# Patient Record
Sex: Female | Born: 1975
Health system: Southern US, Community
[De-identification: ages and names within clinical notes are randomized; demographics above are authoritative.]

## PROBLEM LIST (undated history)

## (undated) DIAGNOSIS — J45909 Unspecified asthma, uncomplicated: Secondary | ICD-10-CM

## (undated) DIAGNOSIS — K219 Gastro-esophageal reflux disease without esophagitis: Secondary | ICD-10-CM

## (undated) DIAGNOSIS — E860 Dehydration: Secondary | ICD-10-CM

## (undated) DIAGNOSIS — N61 Mastitis without abscess: Principal | ICD-10-CM

## (undated) DIAGNOSIS — D6489 Other specified anemias: Secondary | ICD-10-CM

## (undated) DIAGNOSIS — J309 Allergic rhinitis, unspecified: Secondary | ICD-10-CM

## (undated) DIAGNOSIS — F341 Dysthymic disorder: Secondary | ICD-10-CM

## (undated) DIAGNOSIS — T7840XA Allergy, unspecified, initial encounter: Secondary | ICD-10-CM

## (undated) HISTORY — DX: Unspecified asthma, uncomplicated: J45.909

## (undated) HISTORY — PX: NO PAST SURGERIES: SHX2092

## (undated) HISTORY — DX: Allergic rhinitis, unspecified: J30.9

## (undated) HISTORY — DX: Mastitis without abscess: N61.0

## (undated) HISTORY — DX: Allergy, unspecified, initial encounter: T78.40XA

## (undated) HISTORY — DX: Dysthymic disorder: F34.1

## (undated) HISTORY — DX: Other specified anemias: D64.89

## (undated) HISTORY — DX: Dehydration: E86.0

---

## 1998-08-28 ENCOUNTER — Other Ambulatory Visit: Admission: RE | Admit: 1998-08-28 | Discharge: 1998-08-28 | Payer: Self-pay | Admitting: Gynecology

## 1999-09-02 ENCOUNTER — Other Ambulatory Visit: Admission: RE | Admit: 1999-09-02 | Discharge: 1999-09-02 | Payer: Self-pay | Admitting: Gynecology

## 2000-01-23 ENCOUNTER — Encounter: Admission: RE | Admit: 2000-01-23 | Discharge: 2000-01-23 | Payer: Self-pay | Admitting: Sports Medicine

## 2000-09-08 ENCOUNTER — Other Ambulatory Visit: Admission: RE | Admit: 2000-09-08 | Discharge: 2000-09-08 | Payer: Self-pay | Admitting: Gynecology

## 2001-08-31 ENCOUNTER — Other Ambulatory Visit: Admission: RE | Admit: 2001-08-31 | Discharge: 2001-08-31 | Payer: Self-pay | Admitting: Gynecology

## 2002-04-20 ENCOUNTER — Encounter: Admission: RE | Admit: 2002-04-20 | Discharge: 2002-04-20 | Payer: Self-pay | Admitting: Family Medicine

## 2002-09-20 ENCOUNTER — Other Ambulatory Visit: Admission: RE | Admit: 2002-09-20 | Discharge: 2002-09-20 | Payer: Self-pay | Admitting: Gynecology

## 2004-08-25 ENCOUNTER — Inpatient Hospital Stay (HOSPITAL_COMMUNITY): Admission: AD | Admit: 2004-08-25 | Discharge: 2004-08-27 | Payer: Self-pay | Admitting: Obstetrics and Gynecology

## 2006-01-06 ENCOUNTER — Observation Stay (HOSPITAL_COMMUNITY): Admission: AD | Admit: 2006-01-06 | Discharge: 2006-01-06 | Payer: Self-pay | Admitting: Obstetrics & Gynecology

## 2006-04-22 ENCOUNTER — Ambulatory Visit (HOSPITAL_COMMUNITY): Admission: AD | Admit: 2006-04-22 | Discharge: 2006-04-22 | Payer: Self-pay | Admitting: Obstetrics & Gynecology

## 2006-04-27 ENCOUNTER — Ambulatory Visit (HOSPITAL_COMMUNITY): Admission: AD | Admit: 2006-04-27 | Discharge: 2006-04-27 | Payer: Self-pay | Admitting: Obstetrics & Gynecology

## 2006-06-10 ENCOUNTER — Ambulatory Visit (HOSPITAL_COMMUNITY): Admission: AD | Admit: 2006-06-10 | Discharge: 2006-06-10 | Payer: Self-pay | Admitting: Obstetrics and Gynecology

## 2006-06-12 ENCOUNTER — Encounter (INDEPENDENT_AMBULATORY_CARE_PROVIDER_SITE_OTHER): Payer: Self-pay | Admitting: *Deleted

## 2006-06-12 ENCOUNTER — Inpatient Hospital Stay (HOSPITAL_COMMUNITY): Admission: AD | Admit: 2006-06-12 | Discharge: 2006-06-14 | Payer: Self-pay | Admitting: Obstetrics & Gynecology

## 2008-06-14 ENCOUNTER — Other Ambulatory Visit: Admission: RE | Admit: 2008-06-14 | Discharge: 2008-06-14 | Payer: Self-pay | Admitting: Obstetrics and Gynecology

## 2008-12-31 ENCOUNTER — Ambulatory Visit: Payer: Self-pay | Admitting: Family Medicine

## 2008-12-31 ENCOUNTER — Inpatient Hospital Stay (HOSPITAL_COMMUNITY): Admission: AD | Admit: 2008-12-31 | Discharge: 2009-01-01 | Payer: Self-pay | Admitting: Obstetrics and Gynecology

## 2009-10-30 ENCOUNTER — Other Ambulatory Visit: Admission: RE | Admit: 2009-10-30 | Discharge: 2009-10-30 | Payer: Self-pay | Admitting: Obstetrics and Gynecology

## 2010-05-12 ENCOUNTER — Inpatient Hospital Stay (HOSPITAL_COMMUNITY): Admission: AD | Admit: 2010-05-12 | Discharge: 2010-05-13 | Payer: Self-pay | Admitting: Obstetrics & Gynecology

## 2010-05-12 ENCOUNTER — Encounter: Payer: Self-pay | Admitting: Obstetrics & Gynecology

## 2010-05-12 ENCOUNTER — Ambulatory Visit: Payer: Self-pay | Admitting: Obstetrics and Gynecology

## 2010-08-12 ENCOUNTER — Encounter: Payer: Self-pay | Admitting: Family Medicine

## 2010-11-21 ENCOUNTER — Ambulatory Visit
Admission: RE | Admit: 2010-11-21 | Discharge: 2010-11-21 | Payer: Self-pay | Source: Home / Self Care | Attending: Family Medicine | Admitting: Family Medicine

## 2010-11-21 DIAGNOSIS — J309 Allergic rhinitis, unspecified: Secondary | ICD-10-CM

## 2010-11-21 DIAGNOSIS — F341 Dysthymic disorder: Secondary | ICD-10-CM

## 2010-11-21 DIAGNOSIS — D6489 Other specified anemias: Secondary | ICD-10-CM | POA: Insufficient documentation

## 2010-11-21 DIAGNOSIS — F409 Phobic anxiety disorder, unspecified: Secondary | ICD-10-CM | POA: Insufficient documentation

## 2010-11-21 HISTORY — DX: Dysthymic disorder: F34.1

## 2010-11-21 HISTORY — DX: Allergic rhinitis, unspecified: J30.9

## 2010-11-21 HISTORY — DX: Other specified anemias: D64.89

## 2010-12-18 ENCOUNTER — Telehealth: Payer: Self-pay | Admitting: Family Medicine

## 2010-12-18 ENCOUNTER — Encounter: Payer: Self-pay | Admitting: Family Medicine

## 2010-12-18 NOTE — Letter (Signed)
Summary: [facility]  [facility]   Imported By: Lester [place] 11/26/2010 09:12:16  _____________________________________________________________________  External Attachment:    Type:   Image     Comment:   External Document

## 2010-12-18 NOTE — Assessment & Plan Note (Signed)
Summary: establish care/depoe/vfw   Vital Signs:  Patient profile:   35 year old female Height:      68.75 inches Weight:      149.25 pounds BMI:     22.28 O2 Sat:      99 % on Room air Pulse rate:   86 / minute BP sitting:   120 / 78  (right arm) Cuff size:   regular  Vitals Entered By: Francee Piccolo CMA Duncan Dull) (November 21, 2010 9:53 AM)  O2 Flow:  Room air CC: new to establish//depo injection//SP   History of Present Illness: Patient is a 35yo Caucasian female in today for new patient appt. She is generally in good health but would like to restart her Depo Provera shots. She is over a week overdue so has undergone a urine pregnancy test which was neg and will return in a week for a repeat urine test, if this is neg as well she will restart the Depo Provera. She denies any recent illness or concerns. She has a history of allergies, which are intermittent and are not bothering her at the present time. She has significant ocular symptoms and has had a good response to Optivar, Omnaris and Xyzal in the past as needed. She denies any f/c/HA/CP/SOB/GI or GU c/o. She has been on an SSRI for several years, was initially on Lexapro which worked well for a number of years but stopped working during her last pregnancy. She notes she does have an element of panic disorder at times. These symptoms are infrequent but she does get a sense of palpitations and increased anxiety at random times. She reports sometimes just worrying about the feeling is enough to bring on the symptoms. Ususally if she takes a deep breath and relaxes the symptoms will dissipate.   Preventive Screening-Counseling & Management  Alcohol-Tobacco     Smoking Status: never  Caffeine-Diet-Exercise     Does Patient Exercise: yes      Drug Use:  no.    Current Problems (verified): 1)  Anxiety Depression  (ICD-300.4) 2)  Other Specified Anemias  (ICD-285.8) 3)  Allergic Rhinitis  (ICD-477.9)  Current Medications  (verified): 1)  Zoloft 100 Mg Tabs (Sertraline Hcl) .... Take 2 Tablets By Mouth Daily 2)  Prenatal Vitamin .... Take 1 Tablet By Mouth Once A Day  Allergies (verified): No Known Drug Allergies  Past History:  Past Surgical History: Denies surgical history  Family History: Father: 51, DMII, PUD Mother:65, hyperlipidemia, OA Siblings:  Sister: 51, Migraines Brother: 28, Recovering from substance abuse, Bipolar D/O, panic disorder. MGM: deceased in early 42s, uterine cancer, arthritis MGF: deceased in early 25s, heart disease w/multiple heart attacks, cancer PGM: deceased in early 96s, DM, HTN,  PGF: deceased in mid 34s, Multiple Myeloma Children: Son: 6, A&W Son: 4, A&W Daughter: 4, A&W Daghter: 22 months, A&W Daughter: 6 months, A&W  Social History: Married Never Smoked Occupation: Therapist, music Alcohol use-yes, rare special occasions Drug use-no Regular exercise-yes, yoga intermittently Seat belt regularly No dietary restrictions Smoking Status:  never Occupation:  employed Drug Use:  no Does Patient Exercise:  yes  Review of Systems  The patient denies anorexia, fever, weight loss, weight gain, vision loss, decreased hearing, hoarseness, chest pain, syncope, dyspnea on exertion, peripheral edema, prolonged cough, headaches, hemoptysis, abdominal pain, melena, hematochezia, severe indigestion/heartburn, hematuria, incontinence, muscle weakness, suspicious skin lesions, transient blindness, difficulty walking, depression, unusual weight change, breast masses, and testicular masses.    Physical Exam  General:  Well-developed,well-nourished,in no acute distress; alert,appropriate and cooperative throughout examination Head:  Normocephalic and atraumatic without obvious abnormalities. No apparent alopecia or balding. Eyes:  No corneal or conjunctival inflammation noted. EOMI. Perrla. Funduscopic exam benign, without hemorrhages, exudates or papilledema. Vision  grossly normal. Ears:  External ear exam shows no significant lesions or deformities.  Otoscopic examination reveals clear canals, tympanic membranes are intact bilaterally without bulging, retraction, inflammation or discharge. Hearing is grossly normal bilaterally. Nose:  External nasal examination shows no deformity or inflammation. Nasal mucosa are pink and moist without lesions or exudates. Mouth:  Oral mucosa and oropharynx without lesions or exudates.  Teeth in good repair. Neck:  No deformities, masses, or tenderness noted. Lungs:  Normal respiratory effort, chest expands symmetrically. Lungs are clear to auscultation, no crackles or wheezes. Heart:  Normal rate and regular rhythm. S1 and S2 normal without gallop, murmur, click, rub or other extra sounds. Abdomen:  Bowel sounds positive,abdomen soft and non-tender without masses, organomegaly or hernias noted. Msk:  No deformity or scoliosis noted of thoracic or lumbar spine.   Pulses:  R and L carotid,radial,femoral,dorsalis pedis and posterior tibial pulses are full and equal bilaterally Extremities:  No clubbing, cyanosis, edema, or deformity noted with normal full range of motion of all joints.   Neurologic:  No cranial nerve deficits noted. Station and gait are normal. Plantar reflexes are down-going bilaterally. DTRs are symmetrical throughout. Sensory, motor and coordinative functions appear intact. Skin:  Intact without suspicious lesions or rashes Cervical Nodes:  No lymphadenopathy noted Psych:  Cognition and judgment appear intact. Alert and cooperative with normal attention span and concentration. No apparent delusions, illusions, hallucinations   Impression & Recommendations:  Problem # 1:  OTHER SPECIFIED ANEMIAS (ICD-285.8) Had trouble with mild anemia during pregnancy, encouraged plenty of leafy greens and repeat a CBC in next few months with annual labs  Problem # 2:  ANXIETY DEPRESSION (ICD-300.4) Is on Sertraline  200mg  daily, Has only been on this dose just over a month. If her response if iincomplete or her panic component worsens would consider switching to a different agent such as Vibryd or Effexor  Problem # 3:  ALLERGIC RHINITIS (ICD-477.9)  Her updated medication list for this problem includes:    Omnaris 50 Mcg/act Susp (Ciclesonide) .Marland Kitchen... 1-2 sprays each nostril    Xyzal 5 Mg Tabs (Levocetirizine dihydrochloride) .Marland Kitchen... 1 tab by mouth daily as needed allergies Does not have any conerning symptoms but may continue to use as needed meds.  Complete Medication List: 1)  Zoloft 100 Mg Tabs (Sertraline hcl) .... Take 2 tablets by mouth daily 2)  Prenatal Vitamin  .... Take 1 tablet by mouth once a day 3)  Omnaris 50 Mcg/act Susp (Ciclesonide) .Marland Kitchen.. 1-2 sprays each nostril 4)  Xyzal 5 Mg Tabs (Levocetirizine dihydrochloride) .Marland Kitchen.. 1 tab by mouth daily as needed allergies 5)  Proair Hfa 108 (90 Base) Mcg/act Aers (Albuterol sulfate) .Marland Kitchen.. 1-2 puffs by mouth q 4-6 hours as needed wheeze   Orders Added: 1)  New Patient 18-39 years [[id number]]    Laboratory Results   Urine Tests  Date/Time Received: November 21, 2010 10:49 AM Date/Time Reported: November 21, 2010 10:49 AM    Urine HCG: negative

## 2010-12-24 NOTE — Progress Notes (Signed)
Summary: Needs nasal spray  Phone Note Call from Patient Call back at Home Phone [phone]   Caller: Patient Summary of Call: Pt needs nasal spray, pls send in to [facility] [address]- generic Flon[facility] Initial call taken by: Lannette Donath,  December 18, 2010 9:41 AM  Follow-up for Phone Call        [facility]  Follow-up by: Danise Edge MD,  December 18, 2010 11:11 AM    New/Updated Medications: FLUTICASONE PROPIONATE 50 MCG/ACT SUSP (FLUTICASONE PROPIONATE) 2 sprays each nostril daily as needed allergies Prescriptions: FLUTICASONE PROPIONATE 50 MCG/ACT SUSP (FLUTICASONE PROPIONATE) 2 sprays each nostril daily as needed allergies  #1 bottle x 3   Entered and Authorized by:   Danise Edge MD   Signed by:   Danise Edge MD on 12/18/2010   Method used:   Electronically to        [facility]  [address] [phone]* (retail)       [address] [place], [place]  [address]       Ph: [phone] or [phone]       Fax: [phone]   RxID:   [id number]

## 2011-02-01 LAB — CBC
HCT: 29.3 % — ABNORMAL LOW (ref 36.0–46.0)
Hemoglobin: 10.1 g/dL — ABNORMAL LOW (ref 12.0–15.0)
Hemoglobin: 11.5 g/dL — ABNORMAL LOW (ref 12.0–15.0)
MCH: 27.8 pg (ref 26.0–34.0)
MCHC: 34.6 g/dL (ref 30.0–36.0)
MCV: 81.6 fL (ref 78.0–100.0)
RBC: 3.55 MIL/uL — ABNORMAL LOW (ref 3.87–5.11)
RBC: 4.13 MIL/uL (ref 3.87–5.11)
WBC: 9.5 10*3/uL (ref 4.0–10.5)

## 2011-02-01 LAB — RPR: RPR Ser Ql: NONREACTIVE

## 2011-03-03 LAB — CBC
HCT: 31.6 % — ABNORMAL LOW (ref 36.0–46.0)
Hemoglobin: 10.6 g/dL — ABNORMAL LOW (ref 12.0–15.0)
MCV: 83.1 fL (ref 78.0–100.0)
Platelets: 122 10*3/uL — ABNORMAL LOW (ref 150–400)
WBC: 6.8 10*3/uL (ref 4.0–10.5)

## 2011-03-16 ENCOUNTER — Ambulatory Visit (INDEPENDENT_AMBULATORY_CARE_PROVIDER_SITE_OTHER): Payer: Self-pay | Admitting: Family Medicine

## 2011-03-16 ENCOUNTER — Encounter: Payer: Self-pay | Admitting: Family Medicine

## 2011-03-16 VITALS — BP 130/88 | HR 72 | Temp 98.7°F | Ht 68.75 in | Wt 141.8 lb

## 2011-03-16 DIAGNOSIS — F410 Panic disorder [episodic paroxysmal anxiety] without agoraphobia: Secondary | ICD-10-CM

## 2011-03-16 DIAGNOSIS — F341 Dysthymic disorder: Secondary | ICD-10-CM

## 2011-03-16 DIAGNOSIS — J309 Allergic rhinitis, unspecified: Secondary | ICD-10-CM

## 2011-03-16 DIAGNOSIS — F32A Depression, unspecified: Secondary | ICD-10-CM

## 2011-03-16 DIAGNOSIS — F329 Major depressive disorder, single episode, unspecified: Secondary | ICD-10-CM

## 2011-03-16 MED ORDER — SERTRALINE HCL 100 MG PO TABS: ORAL_TABLET | ORAL | Status: DC

## 2011-03-16 MED ORDER — ALPRAZOLAM 0.25 MG PO TABS: ORAL_TABLET | ORAL | Status: DC

## 2011-03-16 MED ORDER — VENLAFAXINE HCL ER 37.5 MG PO CP24: ORAL_CAPSULE | ORAL | Status: DC

## 2011-03-16 MED ORDER — VENLAFAXINE HCL ER 75 MG PO CP24: 75.0000 mg | ORAL_CAPSULE | Freq: Every day | ORAL | Status: DC

## 2011-03-16 NOTE — Patient Instructions (Signed)
Anxiety and Panic Attacks Your caregiver has informed you that you are having an anxiety or panic attack. There may be many forms of this. Most of the time these attacks come suddenly and without warning. They come at any time of day, including periods of sleep, and at any time of life. They may be strong and unexplained. Although panic attacks are very scary, they are physically harmless. Sometimes the cause of your anxiety is not known. Anxiety is a protective mechanism of the body in its fight or flight mechanism. Most of these perceived danger situations are actually nonphysical situations (such as anxiety over losing a job). CAUSES The causes of an anxiety or panic attack are many. Panic attacks may occur in otherwise healthy people given a certain set of circumstances. There may be a genetic cause for panic attacks. Some medications may also have anxiety as a side effect. SYMPTOMS Some of the most common feelings are:  Intense terror.  Dizziness, feeling faint.   Hot and cold flashes.   Fear of going crazy.   Feelings that nothing is real.   Sweating.   Shaking.   Chest pain or a fast heartbeat (palpitations).  Smothering, choking sensations.   Feelings of impending doom and that death is near.   Tingling of extremities, this may be from over breathing.   Altered reality (derealization).   Being detached from yourself (depersonalization).   Several symptoms can be present to make up anxiety or panic attacks. DIAGNOSIS The evaluation by your caregiver will depend on the type of symptoms you are experiencing. The diagnosis of anxiety or pain attack is made when no physical illness can be determined to be a cause of the symptoms. TREATMENT Treatment to prevent anxiety and panic attacks may include:  Avoidance of circumstances that cause anxiety.   Reassurance and relaxation.   Regular exercise.   Relaxation therapies, such as yoga.   Psychotherapy with a psychiatrist  or therapist.   Avoidance of caffeine, alcohol and illegal drugs.   Prescribed medication.  SEEK IMMEDIATE MEDICAL CARE IF:  You experience panic attack symptoms that are different than your usual symptoms.   You have any worsening or concerning symptoms.  Document Released: 11/02/2005 Document Re-Released: 04/22/2010 Exit[employer] Patient Information 2011 [employer]. 

## 2011-03-18 ENCOUNTER — Encounter: Payer: Self-pay | Admitting: Family Medicine

## 2011-03-18 NOTE — Progress Notes (Signed)
[name] [id number] [birth year] 03/18/2011      Progress Note-Follow Up  Subjective  Chief Complaint  Chief Complaint  Patient presents with  . Follow-up    discuss meds    HPI  Patient is a 35 year old Caucasian female in today for reevaluation of multiple medical problems. Her biggest concern is her persistent anxiety and depression. The depression is mostly controlled with only mild and you her bigger concern is anxiety bordering on the verge of Cortef. She reports no anxiety but feels as if it'll turn into a panic attack at home but notes she's starting to notice when she goes out she is getting some palpitations feeling jittery and sometimes even a little dyspneic. She is interested in switching medications at this time due to the increasing symptoms. She reports otherwise been in good health denies any recent illness, fevers, chills, chest pain, palpitations, shortness of breath, GI or GU complaints. She without c/o allergic symptoms using prescribed meds as needed. Continues to nurse her youngest daughter without difficulty  Past Medical History  Diagnosis Date  . Allergy     seasonal  . ANXIETY DEPRESSION 11/21/2010  . ALLERGIC RHINITIS 11/21/2010  . Other specified anemias 11/21/2010    History reviewed. No pertinent past surgical history.  Family History  Problem Relation Age of Onset  . Hyperlipidemia Mother   . Osteoarthritis Mother   . Diabetes Father     Type 2  . Ulcers Father   . Migraines Sister   . Bipolar disorder Brother   . Panic disorder Brother   . Arthritis Maternal Grandmother   . Cancer Maternal Grandmother     uterine  . Cancer Maternal Grandfather   . Heart disease Maternal Grandfather   . Heart attack Maternal Grandfather   . Diabetes Paternal Grandmother   . Hypertension Paternal Grandmother   . Cancer Paternal Grandfather     multiple myeloma    History   Social History  . Marital Status: Married    Spouse Name: N/A    Number of  Children: N/A  . Years of Education: N/A   Occupational History  . Not on file.   Social History Main Topics  . Smoking status: Never Smoker   . Smokeless tobacco: Never Used  . Alcohol Use: 0.0 oz/week    0 drink(s) per week     rare occasional glass of wine  . Drug Use: No  . Sexually Active: Yes   Other Topics Concern  . Not on file   Social History Narrative  . No narrative on file    Current Outpatient Prescriptions on File Prior to Visit  Medication Sig Dispense Refill  . albuterol (PROAIR HFA) 108 (90 BASE) MCG/ACT inhaler Inhale 2 puffs into the lungs every 6 (six) hours as needed. For wheezing       . ciclesonide (OMNARIS) 50 MCG/ACT nasal spray 2 sprays by Each Nare route daily as needed.        . Prenatal Vit-Fe Sulfate-FA (PRENATAL VITAMIN PO) Take by mouth daily.        Marland Kitchen levocetirizine (XYZAL) 5 MG tablet Take 5 mg by mouth daily as needed. For allergies         No Known Allergies  Review of Systems  Review of Systems  Constitutional: Negative for fever and malaise/fatigue.  HENT: Negative for congestion.   Eyes: Negative for discharge.  Respiratory: Negative for shortness of breath.   Cardiovascular: Negative for chest pain, palpitations and leg swelling.  Gastrointestinal: Negative for nausea, abdominal pain and diarrhea.  Genitourinary: Negative for dysuria.  Musculoskeletal: Negative for falls.  Skin: Negative for rash.  Neurological: Negative for loss of consciousness and headaches.  Endo/Heme/Allergies: Negative for polydipsia.  Psychiatric/Behavioral: Positive for depression. Negative for suicidal ideas and substance abuse. The patient is nervous/anxious. The patient does not have insomnia.     Objective  BP 130/88  Pulse 72  Temp(Src) 98.7 F (37.1 C) (Oral)  Ht 5' 8.75" (1.746 m)  Wt 141 lb 12.8 oz (64.32 kg)  BMI 21.09 kg/m2  SpO2 100%  Physical Exam  Physical Exam  Constitutional: She is oriented to person, place, and time and  well-developed, well-nourished, and in no distress. No distress.  HENT:  Head: Normocephalic and atraumatic.  Eyes: Conjunctivae are normal.  Neck: Neck supple. No thyromegaly present.  Cardiovascular: Normal rate, regular rhythm and normal heart sounds.   No murmur heard. Pulmonary/Chest: Effort normal and breath sounds normal. She has no wheezes.  Abdominal: She exhibits no distension and no mass.  Musculoskeletal: She exhibits no edema.  Lymphadenopathy:    She has no cervical adenopathy.  Neurological: She is alert and oriented to person, place, and time.  Skin: Skin is warm and dry. No rash noted. She is not diaphoretic.  Psychiatric: Memory, affect and judgment normal.    No results found for this basename: TSH   Lab Results  Component Value Date   WBC 6.6 05/13/2010   HGB 10.1* 05/13/2010   HCT 29.3* 05/13/2010   MCV 82.6 05/13/2010   PLT 123* 05/13/2010       Assessment & Plan  ANXIETY DEPRESSION Patient continues to struggle with some symptoms of anxiety and depression, the anxiety is bordering on agoraphobia with patient really only noting symptoms when she tries to go out. She will develop palpitations, feel jittery and SOB. She reports the depression is still present but feels this is actually mild. She is interested in switching medications. We will switch from Sertraline to Venlafaxine XR 37.5 mg daily and titrate up to 75mg  daily. Is still nursing so she will take her medication after nursing her child at bedtime since she does not nurse for roughly 10 hours after this. She is also given a small amount of Alprazolam if a full panic attack hits, will have her not nurse for one to two hours after dosing. Call if any concerns  ALLERGIC RHINITIS No c/o today may continue to use medications prn

## 2011-03-18 NOTE — Assessment & Plan Note (Signed)
No c/o today may continue to use medications prn

## 2011-03-18 NOTE — Assessment & Plan Note (Signed)
Patient continues to struggle with some symptoms of anxiety and depression, the anxiety is bordering on agoraphobia with patient really only noting symptoms when she tries to go out. She will develop palpitations, feel jittery and SOB. She reports the depression is still present but feels this is actually mild. She is interested in switching medications. We will switch from Sertraline to Venlafaxine XR 37.5 mg daily and titrate up to 75mg  daily. Is still nursing so she will take her medication after nursing her child at bedtime since she does not nurse for roughly 10 hours after this. She is also given a small amount of Alprazolam if a full panic attack hits, will have her not nurse for one to two hours after dosing. Call if any concerns

## 2011-03-30 ENCOUNTER — Other Ambulatory Visit: Payer: Self-pay

## 2011-04-03 NOTE — Op Note (Signed)
NAMEKARLEE, STAFF             ACCOUNT NO.:  [id number]   MEDICAL RECORD NO.:  [id number]          PATIENT TYPE:  INP   LOCATION:  LDR4                          FACILITY:  [facility]   PHYSICIAN:  Tilda Burrow, M.D. DATE OF BIRTH:  10-22-1976   DATE OF PROCEDURE:  08/25/2004  DATE OF DISCHARGE:                                 OPERATIVE REPORT   PROCEDURE:  Epidural catheter.   Time:  6:30 p.m.   Continuous lumbar epidural catheter placed at L3-4 interspace on first  attempt at a depth of approximately 5 cm, finally identifying loss of  resistance technique without difficulty.  Xylocaine 1.5% with epinephrine 5  mL is infused, followed by easy insertion of the epidural catheter at 3 cm  into the epidural space with taping of the catheter to the back and 10 mL  bolus of test dose administered, followed by 12 mL/hr.  At the time of this  dictation at 6:40 p.m., the patient is still having set-up of the analgesic  effect, is tolerating labor slightly better, and was at last check 5 cm  dilated.  Labor intensity has picked up since amniotomy.  Good prognosis for  vaginal delivery before midnight is anticipated.     John   JVF/MEDQ  D:  08/25/2004  T:  08/26/2004  Job:  [id number]   cc:   Francoise Schaumann. Halm, D.O.  [address], [address]  [place]  [place] [address]  Fax: [phone]

## 2011-04-03 NOTE — H&P (Signed)
NAME:  [name], VENDETTI             ACCOUNT NO.:  [id number]   MEDICAL RECORD NO.:  [id number]           PATIENT TYPE:  OIB   LOCATION:  A420                          FACILITY:  [facility]   PHYSICIAN:  Tilda Burrow, M.D. DATE OF BIRTH:  May 11, 1976   DATE OF ADMISSION:  01/06/2006  DATE OF DISCHARGE:  LH                                HISTORY & PHYSICAL   REASON FOR ADMISSION:  Pregnancy at 15 weeks 1 day, twin gestation, nausea,  vomiting, dehydration for the past 48 hours.   HISTORY OF PRESENT ILLNESS:  For the past 48 hours, Florentina Addison has had persistent  nausea and vomiting, unable to tolerate any p.o.'s at all.   MEDICAL HISTORY:  Positive for asthma.   SURGICAL HISTORY:  Negative.   MEDICATIONS:  1.  Albuterol p.r.n.  2.  Lexapro 10 a day.   PHYSICAL EXAMINATION:  VITAL SIGNS:  Weight is 137 and 2/3rds, blood  pressure 110/70, temperature 98.3.  She has 3+ ketones in her urine.  Fetal  heart rates are as follows:  140s on twin A and 150s on twin B.   PLAN:  We are going to admit.  IV hydration.  Assess electrolyte balance.  Medicate with anti-emetics.  Hopefully, by this evening, she will be able to  be discharged home.      Zerita Boers, Lanier Clam      Tilda Burrow, M.D.  Electronically Signed    DL/MEDQ  D:  10/62/6948  T:  01/06/2006  Job:  [id number]

## 2011-04-03 NOTE — Op Note (Signed)
NAMEPHILOMENA, [name]             ACCOUNT NO.:  [id number]   MEDICAL RECORD NO.:  [id number]          PATIENT TYPE:  INP   LOCATION:  LDR4                          FACILITY:  [facility]   PHYSICIAN:  Lazaro Arms, M.D.   DATE OF BIRTH:  [birth year]   DATE OF PROCEDURE:  06/12/2006  DATE OF DISCHARGE:                                 OPERATIVE REPORT   PROCEDURE PERFORMED:  Epidural placement.   SURGEON:  Lazaro Arms, M.D.   HISTORY:  [name] is a 35 year old white female, gravida 2, para 1 with twins,  5 cm dilated, 75% effaced who is having regular uterine contractions.  She  is requesting an epidural be placed.   DESCRIPTION OF THE PROCEDURE:  The patient was placed in the sitting  position.  Betadine prep was used.  One percent lidocaine was injected in  the L3, L4 interspace.  A 17-gauge Tuohy needle was used and loss of  resistance technique employed.  An epidural was placed with one pass without  difficulty.  Ten milliliters of 0.125% bupivacaine was given as a test dose  without ill-effects.  The epidural catheter was then fed into the space and  taken down 5 cm into the space.  An additional 10 mL was given to dose up  the epidural and the continuous infusion was begun at 12 mL an hour.   The patient tolerated the procedure well without ill-effects and was getting  good pain relief.      Lazaro Arms, M.D.  Electronically Signed     LHE/MEDQ  D:  06/12/2006  T:  06/12/2006  Job:  [id number]

## 2011-04-03 NOTE — Consult Note (Signed)
[name], [name]             ACCOUNT NO.:  [id number]   MEDICAL RECORD NO.:  [id number]          PATIENT TYPE:  OIB   LOCATION:  A415                          FACILITY:  [facility]   PHYSICIAN:  Tilda Burrow, M.D. DATE OF BIRTH:  Apr 03, 1976   DATE OF CONSULTATION:  04/22/2006  DATE OF DISCHARGE:  04/22/2006                                   CONSULTATION   CHIEF COMPLAINT:  Spotting at 30 weeks.   This 35 year old female at [redacted] weeks gestation with twin gestation, symmetric  growth to date, is seen in Labor & Delivery after a single episode of  spotting earlier today with no palpable contractions.  She presents to Labor  & Delivery.  External monitoring shows only one or two very minimal  evidences of uterine tightening, no regular pattern.  [name] is quite slim  so any contractions would be noted.  The babies are quite active with  reactive criteria met for NST.  Cervical exam shows no blood at all on the  glove and also [name] has been to the bathroom 2-3 times with no additional  spotting noted.  Cervix feels quite soft and the external os is open.  Transvaginal ultrasound is performed.  Underlying transvaginal ultrasound by  J.V. Ferguson to assess the cervical length.  Cervix is 4.9 cm in length  from internal os to external os, there is no funneling and no significant  cervical dilation internally.   IMPRESSION:  Soft multiparous cervix, with spotting, no evidence of pre-term  labor and no evidence of passive cervical dilation on ultrasound.   PLAN:  As a precaution, will see patient back Tuesday for NST and for  monitoring for contractions prior to followup visit with Dr. Despina Hidden.      Tilda Burrow, M.D.  Electronically Signed     JVF/MEDQ  D:  04/22/2006  T:  04/23/2006  Job:  [id number]

## 2011-04-03 NOTE — Op Note (Signed)
NAMEWYNELLE, [name]             ACCOUNT NO.:  [id number]   MEDICAL RECORD NO.:  [id number]          PATIENT TYPE:  INP   LOCATION:  LDR4                          FACILITY:  [facility]   PHYSICIAN:  Tilda Burrow, M.D. DATE OF BIRTH:  20-Nov-1975   DATE OF PROCEDURE:  08/25/2004  DATE OF DISCHARGE:                                 OPERATIVE REPORT   DELIVERY NOTE:  Delivery time:  10:01 p.m.   Ms. Kohn progressed slowly through the day.  She had her epidural placed  at approximately 6 p.m., approximately a half hour after we had ruptured  membranes at 5 cm.  The epidural was in place and had a left-sided  predominance initially and required a second bolus dose with the patient on  the right side to achieve symmetric relief.  She progressed rapidly to 9 cm  and slowly thereafter.  She was allowed to have epidural slide and was  confirmed as being complete at 8:30 p.m. and pushed effectively for  approximately an hour, upon which time the epidural was discontinued.  She  was unable to complete delivery without assistance, requested vacuum  assistance be utilized, which was used through three contractions.  It had  one pop-off initially before scalp electrode was removed to improve seating  of the Kiwi vacuum extractor, and then we gently used to it to assist with  her pushing through two additional contractions.  She was able to bring the  baby in a crowning position, and we let her push again without the vacuum  extractor in place for two contractions.  She was almost able to complete  the delivery, and then we reattached the vacuum to assist one final time and  resulted in easy delivery with extension of the second degree laceration to  a deep second degree laceration with two subclitoral lacerations as well.  The infant was 8 pounds 1 ounce, Apgars 9 and 9, delivered easily after the  head was out.  There was no shoulder dystocia.  The cord was clamped, then  cut by Dr. Milinda Cave, and  then the baby placed on maternal abdomen for a  period of time before going to the warmer.  The placenta delivered easily,  Schultze presentation, after blood gases were obtained.  Blood loss was 500-  750 mL.  The deep laceration required repair under local anesthesia, a  series of interrupted sutures followed by continuous running subcuticular 2-  0 Vicryl with good tissue edge approximation.  The patient tolerated the  procedure well, showed quiet interest in the baby with good extended family  support.     John   JVF/MEDQ  D:  08/25/2004  T:  08/26/2004  Job:  [id number]   cc:   Francoise Schaumann. Halm, D.O.  [address]  [place]  [place] [address]  Fax: [phone]

## 2011-04-03 NOTE — H&P (Signed)
[name], [name]             ACCOUNT NO.:  [id number]   MEDICAL RECORD NO.:  [id number]          PATIENT TYPE:  INP   LOCATION:  LDR4                          FACILITY:  [facility]   PHYSICIAN:  Tilda Burrow, M.D. DATE OF BIRTH:  07/17/1976   DATE OF ADMISSION:  08/25/2004  DATE OF DISCHARGE:  LH                                HISTORY & PHYSICAL   ADMISSION DIAGNOSIS:  1.  Pregnancy at [redacted] weeks gestation.  2.  Prodromal labor.   HISTORY OF PRESENT ILLNESS:  This 35 year old female, gravida 1, para 0, LMP  November 27, 2003, placing [facility] September 02, 2004, with ultrasound in first and  second trimester corresponding within one week of menstrual EDC, is admitted  at 39 weeks by consensus criteria after pregnancy course followed through  our office notable for a 24-pound weight gain, appropriate fundal height  growth, blood type AB positive, urine drug screen negative, rubella  immunity.  Present hemoglobin 14, hematocrit 43.  Hepatitis, HIV, GC,  chlamydia, RPR are all nonreactive.  MSAFP declined.  Group B strep negative  with glucose tolerance test 83 mg percent.   The patient plans to breast feed, plans circumcision, and wants IV  medication for analgesia with possible epidural if necessary.   PAST MEDICAL HISTORY:  Exercise-induced asthma.   PAST SURGICAL HISTORY:  Negative.   ALLERGIES:  None.   HABITS:  Cigarettes, alcohol, and recreational drugs denied.   MEDICATIONS:  Albuterol inhaler p.r.n., used monthly.   PHYSICAL EXAMINATION:  GENERAL:  Healthy, tall, Caucasian female.  VITAL SIGNS:  Height 5 feet 9 inches, weight 158.  Blood pressure 130/88.  ABDOMEN/PELVIC:  Fundal height 36 cm at last prenatal visit.  Cervix is 4 cm  dilated by nursing evaluation.  Membranes are intact.  No bloody show today.  Contractions are of a mild nature, tolerated well by the patient.  Fetal  heart rate tracing has a reactive pattern.   IMPRESSION:  1.  Pregnancy at 39 weeks.  2.   Latent phase labor.   PLAN:  1.  Allow to labor.  2.  Will avoid Phenergan, analgesia during labor due to undesirable sedating      effect on patient, IV Nubain for analgesia, possible epidural.   ADDENDUM:  The baby's name will be [name].     John   JVF/MEDQ  D:  08/25/2004  T:  08/25/2004  Job:  [id number]   cc:   Francoise Schaumann. Halm, D.O.  [address]  [place]  [place] [address]  Fax: [phone]

## 2011-04-03 NOTE — H&P (Signed)
NAMEOFILIA, RAYON NO.:  [id number]   MEDICAL RECORD NO.:  [id number]          PATIENT TYPE:  INP   LOCATION:  LDR4                          FACILITY:  [facility]   PHYSICIAN:  Lazaro Arms, M.D.   DATE OF BIRTH:  1976/03/10   DATE OF ADMISSION:  06/12/2006  DATE OF DISCHARGE:  LH                                HISTORY & PHYSICAL   [name] is a 35 year old white female, gravida 2 para 1, with estimated date  of delivery of July 01, 2006, currently at 37-4/7th's weeks' gestation  with twins.  This has been an otherwise uncomplicated pregnancy.  She had a  placenta previa early on but that resolved.  She has had no admissions for  preterm labor.  No treatment for preterm labor.  It has been a very  straightforward twin gestation.  There has been concordant growth  throughout.  She has had ultrasounds per month.   PAST MEDICAL HISTORY:  1.  Asthma.  2.  Anxiety disorder.  3.  Questionable __________  migraines.   PAST SURGICAL HISTORY:  Negative.   PAST OBSTETRIC HISTORY:  She had a vaginal delivery in 2005, uncomplicated  pregnancy and delivery.   Blood type is AB positive.  Rubella immune.  Hepatitis B is negative.  HIV  is nonreactive.  Serology is nonreactive.  Pap is normal.  GC and Chlamydia  was negative x2.   REVIEW OF SYSTEMS:  Otherwise negative.   PHYSICAL EXAMINATION:  PELVIC:  Cervix on admission is 5, 75, 0 station,  vertex, bulging bag of water.   IMPRESSION:  1.  Twin gestation at 37.5 weeks' gestation.  2.  Active labor.   PLAN:  1.  We will place an epidural.  2.  Plan on vaginal delivery.  3.  We will have the operating room staff in stand-by mode, anticipation of      any problems with delivery of the second twin.  They are vertex vertex.      Lazaro Arms, M.D.  Electronically Signed     LHE/MEDQ  D:  06/12/2006  T:  06/12/2006  Job:  [id number]

## 2011-04-03 NOTE — Op Note (Signed)
NAME:  NIHAL, DOAN NO.:  [id number]   MEDICAL RECORD NO.:  [id number]          PATIENT TYPE:  INP   LOCATION:  LDR4                          FACILITY:  [facility]   PHYSICIAN:  Lazaro Arms, M.D.   DATE OF BIRTH:  November 09, 1976   DATE OF PROCEDURE:  06/12/2006  DATE OF DISCHARGE:                                 OPERATIVE REPORT   DELIVERY NOTE:   HISTORY:  [name] is 35 years old, gravida 2, para 1 with twins at 37.5 weeks  who came in in labor today.  She underwent an epidural placement, amniotic  and Pitocin augmentation of labor.  She was found to be complete and had  about four maternal expulsive efforts; and at 18:38 hours she delivered a  viable female with Apgars of  9 and 9 weighing  6 pounds 11.4 ounces from the  vertex presentation.  There was a three-vessel cord.  Cord blood and cord  gases were sent.  The cord was marked with a gauze.  The baby was delivered  by Jacklyn Shell, C.N.M.   I was doing the ultrasound and the ultrasound on Baby B showed the baby to  be vertex with a good heart rate in the 140s.  The baby was guided down into  the birth canal manually in the vertex presentation and it was occiput  posterior.  I did an amniotic of  Twin B and there was clear fluid.  It took  about three contractions and she delivered over an intact perineum a viable  female at 18:47 hours with Apgars of  8 and 9.  Again, there was a three-  vessel cord.  Cord blood and cord gas were sent.  The infant underwent  routine neonatal resuscitation.  The placentae were fused and delivered  without difficulty five minutes after delivery.  The uterus was very firm  after delivery three fingerbreadths below the umbilicus.   There was a small first degree laceration in the perineum, which was  repaired with 2-0 Monocryl without difficulty.  The patient was comfortable  throughout with an epidural in place.  The epidural catheter was removed and  the blue tip was  found to be intact.   Both twins were doing well.  Mother was doing well.   ESTIMATED BLOOD LOSS:  The estimated blood loss for the delivery was about  250 mL.      Lazaro Arms, M.D.  Electronically Signed     LHE/MEDQ  D:  06/12/2006  T:  06/12/2006  Job:  [id number]

## 2011-04-22 ENCOUNTER — Ambulatory Visit (INDEPENDENT_AMBULATORY_CARE_PROVIDER_SITE_OTHER): Payer: 59 | Admitting: Family Medicine

## 2011-04-22 ENCOUNTER — Encounter: Payer: Self-pay | Admitting: Family Medicine

## 2011-04-22 DIAGNOSIS — F341 Dysthymic disorder: Secondary | ICD-10-CM

## 2011-04-22 DIAGNOSIS — J441 Chronic obstructive pulmonary disease with (acute) exacerbation: Secondary | ICD-10-CM

## 2011-04-22 DIAGNOSIS — F410 Panic disorder [episodic paroxysmal anxiety] without agoraphobia: Secondary | ICD-10-CM

## 2011-04-22 DIAGNOSIS — F329 Major depressive disorder, single episode, unspecified: Secondary | ICD-10-CM

## 2011-04-22 MED ORDER — VENLAFAXINE HCL ER 75 MG PO CP24: 75.0000 mg | ORAL_CAPSULE | Freq: Every day | ORAL | Status: DC

## 2011-04-22 MED ORDER — METHYLPREDNISOLONE ACETATE 40 MG/ML IJ SUSP: 40.0000 mg | Freq: Once | INTRAMUSCULAR | Status: DC

## 2011-04-22 MED ORDER — CEFTRIAXONE SODIUM 1 G IJ SOLR: 1.0000 g | INTRAMUSCULAR | Status: DC

## 2011-04-22 NOTE — Patient Instructions (Signed)
Depression You have signs of depression. This is a common problem. It can occur at any age. It is often hard to recognize. People can suffer from depression and still have moments of enjoyment. Depression interferes with your basic ability to function in life. It upsets your relationships, sleep, eating, and work habits. CAUSES Depression is believed to be caused by an imbalance in brain chemicals. It may be triggered by an unpleasant event. Relationship crises, a death in the family, financial worries, retirement, or other stressors are normal causes of depression. Depression may also start for no known reason. Other factors that may play a part include medical illnesses, some medicines, genetics, and alcohol or drug abuse. SYMPTOMS  Feeling unhappy or worthless.   Long-lasting (chronic) tiredness or worn-out feeling.   Self-destructive thoughts and actions.   Not being able to sleep or sleeping too much.   Eating more than usual or not eating at all.   Headaches or feeling anxious.   Trouble concentrating or making decisions.   Unexplained physical problems and substance abuse.  TREATMENT Depression usually gets better with treatment. This can include:  Antidepressant medicines. It can take weeks before the proper dose is achieved and benefits are reached.   Talking with a therapist, clergyperson, counselor, or friend. These people can help you gain insight into your problem and regain control of your life.   Eating a good diet.   Getting regular physical exercise, such as walking for 30 minutes every day.   Not abusing alcohol or drugs.  Treating depression often takes 6 months or longer. This length of treatment is needed to keep symptoms from returning. Call your caregiver and arrange for follow-up care as suggested. SEEK IMMEDIATE MEDICAL CARE IF:  You start to have thoughts of hurting yourself or others.   Call your local emergency services (911 in [place]   Go to your  local medical emergency department.   Call the National Suicide Prevention Lifeline: 1-800-273-TALK (1-800-273-8255).  Document Released: 11/02/2005 Document Re-Released: 04/22/2010 [employer] Patient Information 2011 [employer]. 

## 2011-04-23 ENCOUNTER — Encounter: Payer: Self-pay | Admitting: Family Medicine

## 2011-04-23 NOTE — Progress Notes (Signed)
[name] [id number] Oct 18, 1976 04/23/2011      Progress Note-Follow Up  Subjective  Chief Complaint  Chief Complaint  Patient presents with  . Follow-up    6 week follow up    HPI  Patient is a 48 Caucasian female in for evaluation of depression and anxiety. She is pleased with her response to venlafaxine. As she came off the Sertraline and went up on the venlafaxine she definitely noticed an improvement in her mood and irritability. She is still not fully where she would like to be and she notes some persistent low mood. She denies any concerning side effects such as HA/CP or GI disturbances. She has used only a very small amount of her Alprazolam and she notes that a quarter tab was helpful when she needed it  Past Medical History  Diagnosis Date  . Allergy     seasonal  . ANXIETY DEPRESSION 11/21/2010  . ALLERGIC RHINITIS 11/21/2010  . Other specified anemias 11/21/2010    History reviewed. No pertinent past surgical history.  Family History  Problem Relation Age of Onset  . Hyperlipidemia Mother   . Osteoarthritis Mother   . Diabetes Father     Type 2  . Ulcers Father   . Migraines Sister   . Bipolar disorder Brother   . Panic disorder Brother   . Arthritis Maternal Grandmother   . Cancer Maternal Grandmother     uterine  . Cancer Maternal Grandfather   . Heart disease Maternal Grandfather   . Heart attack Maternal Grandfather   . Diabetes Paternal Grandmother   . Hypertension Paternal Grandmother   . Cancer Paternal Grandfather     multiple myeloma    History   Social History  . Marital Status: Married    Spouse Name: N/A    Number of Children: N/A  . Years of Education: N/A   Occupational History  . Not on file.   Social History Main Topics  . Smoking status: Never Smoker   . Smokeless tobacco: Never Used  . Alcohol Use: 0.0 oz/week    0 drink(s) per week     rare occasional glass of wine  . Drug Use: No  . Sexually Active: Yes   Other  Topics Concern  . Not on file   Social History Narrative  . No narrative on file    Current Outpatient Prescriptions on File Prior to Visit  Medication Sig Dispense Refill  . albuterol (PROAIR HFA) 108 (90 BASE) MCG/ACT inhaler Inhale 2 puffs into the lungs every 6 (six) hours as needed. For wheezing       . ALPRAZolam (XANAX) 0.25 MG tablet 1/2 to 1 tab po q 12 hours prn anxiety, minimize dosing while breast  20 tablet  1  . ciclesonide (OMNARIS) 50 MCG/ACT nasal spray 2 sprays by Each Nare route daily as needed.        Marland Kitchen levocetirizine (XYZAL) 5 MG tablet Take 5 mg by mouth daily as needed. For allergies       . Prenatal Vit-Fe Sulfate-FA (PRENATAL VITAMIN PO) Take by mouth daily.          No Known Allergies  Review of Systems  Review of Systems  Constitutional: Negative for fever and malaise/fatigue.  HENT: Negative for congestion.   Eyes: Negative for discharge.  Respiratory: Negative for shortness of breath.   Cardiovascular: Negative for chest pain, palpitations and leg swelling.  Gastrointestinal: Negative for nausea, abdominal pain and diarrhea.  Genitourinary: Negative for  dysuria.  Musculoskeletal: Negative for falls.  Skin: Negative for rash.  Neurological: Negative for loss of consciousness and headaches.  Endo/Heme/Allergies: Negative for polydipsia.  Psychiatric/Behavioral: Positive for depression. Negative for suicidal ideas and substance abuse. The patient is nervous/anxious. The patient does not have insomnia.     Objective  BP 117/77  Pulse 91  Temp(Src) 99 F (37.2 C) (Oral)  Ht 5' 8.75" (1.746 m)  Wt 139 lb 1.9 oz (63.104 kg)  BMI 20.69 kg/m2  SpO2 100%  Physical Exam  Physical Exam  Constitutional: She is oriented to person, place, and time and well-developed, well-nourished, and in no distress. No distress.  HENT:  Head: Normocephalic and atraumatic.  Eyes: Conjunctivae are normal.  Neck: Neck supple. No thyromegaly present.  Cardiovascular:  Normal rate, regular rhythm and normal heart sounds.   No murmur heard. Pulmonary/Chest: Effort normal and breath sounds normal. She has no wheezes.  Abdominal: She exhibits no distension and no mass.  Musculoskeletal: She exhibits no edema.  Lymphadenopathy:    She has no cervical adenopathy.  Neurological: She is alert and oriented to person, place, and time.  Skin: Skin is warm and dry. No rash noted. She is not diaphoretic.  Psychiatric: Memory, affect and judgment normal.     Lab Results  Component Value Date   WBC 6.6 05/13/2010   HGB 10.1* 05/13/2010   HCT 29.3* 05/13/2010   MCV 82.6 05/13/2010   PLT 123* 05/13/2010     Assessment & Plan  ANXIETY DEPRESSION Patient comes in today for evaluation of a switch to venlafaxine. She has been on 75 mg dose for roughly a month now he does believe she is less irritable and able to give her stressors more commonly on the Effexor the circumflex. She does not this is helpful response but is willing to wait for 4 more weeks to see if her workup response continued to improve. We will have her continue 75 mg daily and 2-4 weeks she may choose to increase to 150 daily if her symptoms still not improved to the extent she would like. She will report any concerning symptoms if she increases to 150 he tolerates that dose we will call and 50 mg tablets.

## 2011-04-23 NOTE — Assessment & Plan Note (Signed)
Patient comes in today for evaluation of a switch to venlafaxine. She has been on 75 mg dose for roughly a month now he does believe she is less irritable and able to give her stressors more commonly on the Effexor the circumflex. She does not this is helpful response but is willing to wait for 4 more weeks to see if her workup response continued to improve. We will have her continue 75 mg daily and 2-4 weeks she may choose to increase to 150 daily if her symptoms still not improved to the extent she would like. She will report any concerning symptoms if she increases to 150 he tolerates that dose we will call and 50 mg tablets.

## 2011-05-15 ENCOUNTER — Telehealth: Payer: Self-pay | Admitting: Family Medicine

## 2011-05-15 MED ORDER — VENLAFAXINE HCL ER 150 MG PO CP24: 150.0000 mg | ORAL_CAPSULE | Freq: Every day | ORAL | Status: DC

## 2011-05-15 NOTE — Telephone Encounter (Signed)
She would like to change her dose to 150, please send Rx to [facility] in [facility], please contact patient to confirm

## 2011-07-08 ENCOUNTER — Encounter: Payer: Self-pay | Admitting: Family Medicine

## 2011-07-08 ENCOUNTER — Ambulatory Visit (INDEPENDENT_AMBULATORY_CARE_PROVIDER_SITE_OTHER): Payer: 59 | Admitting: Family Medicine

## 2011-07-08 VITALS — BP 122/75 | HR 77 | Temp 98.2°F | Ht 68.75 in | Wt 134.1 lb

## 2011-07-08 DIAGNOSIS — D6489 Other specified anemias: Secondary | ICD-10-CM

## 2011-07-08 DIAGNOSIS — F329 Major depressive disorder, single episode, unspecified: Secondary | ICD-10-CM

## 2011-07-08 DIAGNOSIS — J309 Allergic rhinitis, unspecified: Secondary | ICD-10-CM

## 2011-07-08 DIAGNOSIS — F341 Dysthymic disorder: Secondary | ICD-10-CM

## 2011-07-08 MED ORDER — VENLAFAXINE HCL ER 75 MG PO TB24: ORAL_TABLET | ORAL | Status: DC

## 2011-07-08 NOTE — Patient Instructions (Signed)

## 2011-07-13 ENCOUNTER — Encounter: Payer: Self-pay | Admitting: Family Medicine

## 2011-07-13 NOTE — Progress Notes (Signed)
[name] [id number] 07/12/1976 07/13/2011      Progress Note-Follow Up  Subjective  Chief Complaint  Chief Complaint  Patient presents with  . Follow-up    on medication    HPI  Patient is a 35 yo Caucasian female who is in today followup for depression and anxiety. Overall she says she is doing well. She reports the venlafaxine is helping. Her mood is in creased and improved to a great degree. She is not needing alprazolam for anxiety. She does note still struggle somewhat with going out but is doing well at home. Would like to be slightly better and more even tempered but overall feels this is a good improvement. No recent illness, fevers, chills, chest pain, palpitations or shortness of breath. Her allergies are generally well controlled and she continues to use meds as needed.  Past Medical History  Diagnosis Date  . Allergy     seasonal  . ANXIETY DEPRESSION 11/21/2010  . ALLERGIC RHINITIS 11/21/2010  . Other specified anemias 11/21/2010    History reviewed. No pertinent past surgical history.  Family History  Problem Relation Age of Onset  . Hyperlipidemia Mother   . Osteoarthritis Mother   . Diabetes Father     Type 2  . Ulcers Father   . Migraines Sister   . Bipolar disorder Brother   . Panic disorder Brother   . Arthritis Maternal Grandmother   . Cancer Maternal Grandmother     uterine  . Cancer Maternal Grandfather   . Heart disease Maternal Grandfather   . Heart attack Maternal Grandfather   . Diabetes Paternal Grandmother   . Hypertension Paternal Grandmother   . Cancer Paternal Grandfather     multiple myeloma    History   Social History  . Marital Status: Married    Spouse Name: N/A    Number of Children: N/A  . Years of Education: N/A   Occupational History  . Not on file.   Social History Main Topics  . Smoking status: Never Smoker   . Smokeless tobacco: Never Used  . Alcohol Use: 0.0 oz/week    0 drink(s) per week     rare  occasional glass of wine  . Drug Use: No  . Sexually Active: Yes   Other Topics Concern  . Not on file   Social History Narrative  . No narrative on file    Current Outpatient Prescriptions on File Prior to Visit  Medication Sig Dispense Refill  . albuterol (PROAIR HFA) 108 (90 BASE) MCG/ACT inhaler Inhale 2 puffs into the lungs every 6 (six) hours as needed. For wheezing       . ALPRAZolam (XANAX) 0.25 MG tablet 1/2 to 1 tab po q 12 hours prn anxiety, minimize dosing while breast  20 tablet  1  . ciclesonide (OMNARIS) 50 MCG/ACT nasal spray 2 sprays by Each Nare route daily as needed.        . Prenatal Vit-Fe Sulfate-FA (PRENATAL VITAMIN PO) Take by mouth daily.        Marland Kitchen venlafaxine (EFFEXOR-XR) 150 MG 24 hr capsule Take 1 capsule (150 mg total) by mouth daily.  30 capsule  1    No Known Allergies  Review of Systems  Review of Systems  Constitutional: Negative for fever and malaise/fatigue.  HENT: Negative for congestion.   Eyes: Negative for discharge.  Respiratory: Negative for shortness of breath.   Cardiovascular: Negative for chest pain, palpitations and leg swelling.  Gastrointestinal: Negative for nausea,  abdominal pain and diarrhea.  Genitourinary: Negative for dysuria.  Musculoskeletal: Negative for falls.  Skin: Negative for rash.  Neurological: Negative for loss of consciousness and headaches.  Endo/Heme/Allergies: Negative for polydipsia.  Psychiatric/Behavioral: Positive for depression. Negative for suicidal ideas. The patient is not nervous/anxious and does not have insomnia.        Improving, no recent need for Alprazolam    Objective  BP 122/75  Pulse 77  Temp(Src) 98.2 F (36.8 C) (Oral)  Ht 5' 8.75" (1.746 m)  Wt 134 lb 1.9 oz (60.836 kg)  BMI 19.95 kg/m2  SpO2 99%  LMP 06/26/2011  Physical Exam   Physical Exam  Constitutional: She is oriented to person, place, and time and well-developed, well-nourished, and in no distress. No distress.    HENT:  Head: Normocephalic and atraumatic.  Eyes: Conjunctivae are normal.  Neck: Neck supple. No thyromegaly present.  Cardiovascular: Normal rate, regular rhythm and normal heart sounds.   No murmur heard. Pulmonary/Chest: Effort normal and breath sounds normal. She has no wheezes.  Abdominal: She exhibits no distension and no mass.  Musculoskeletal: She exhibits no edema.  Lymphadenopathy:    She has no cervical adenopathy.  Neurological: She is alert and oriented to person, place, and time.  Skin: Skin is warm and dry. No rash noted. She is not diaphoretic.  Psychiatric: Memory, affect and judgment normal.   Lab Results  Component Value Date   WBC 6.6 05/13/2010   HGB 10.1* 05/13/2010   HCT 29.3* 05/13/2010   MCV 82.6 05/13/2010   PLT 123* 05/13/2010      Assessment & Plan  ALLERGIC RHINITIS No significant flares, may continue to use medications prn and report any concern.  OTHER SPECIFIED ANEMIAS Patient agrees to return for annual blood work at her next appt.   ANXIETY DEPRESSION Patient is pleased over all with her response to Venlafaxine and feels it is helping her mood. She feels less low and irritable but she still hopes to be slightly better and more even tempered, she is given the option to stay at the Venlafaxine 150mg  dose or to try titrating up to 225mg  daily as needed. If she chooses to increase she will notify the office so we can call in the 22mg  dose capsule

## 2011-07-13 NOTE — Assessment & Plan Note (Signed)
No significant flares, may continue to use medications prn and report any concern.

## 2011-07-13 NOTE — Assessment & Plan Note (Signed)
Patient agrees to return for annual blood work at her next appt.

## 2011-07-13 NOTE — Assessment & Plan Note (Signed)
Patient is pleased over all with her response to Venlafaxine and feels it is helping her mood. She feels less low and irritable but she still hopes to be slightly better and more even tempered, she is given the option to stay at the Venlafaxine 150mg  dose or to try titrating up to 225mg  daily as needed. If she chooses to increase she will notify the office so we can call in the 22mg  dose capsule

## 2011-07-21 ENCOUNTER — Other Ambulatory Visit: Payer: Self-pay | Admitting: Family Medicine

## 2011-08-24 ENCOUNTER — Ambulatory Visit (INDEPENDENT_AMBULATORY_CARE_PROVIDER_SITE_OTHER): Payer: 59 | Admitting: *Deleted

## 2011-08-24 ENCOUNTER — Encounter: Payer: Self-pay | Admitting: *Deleted

## 2011-08-24 DIAGNOSIS — O09529 Supervision of elderly multigravida, unspecified trimester: Secondary | ICD-10-CM

## 2011-08-24 DIAGNOSIS — Z3480 Encounter for supervision of other normal pregnancy, unspecified trimester: Secondary | ICD-10-CM

## 2011-08-24 DIAGNOSIS — IMO0002 Reserved for concepts with insufficient information to code with codable children: Secondary | ICD-10-CM

## 2011-08-24 NOTE — Progress Notes (Signed)
p-101  Pt is here today for your NOB intake.  Pt's husband is a family practice MD with [facility] in [place].  She is happy about this pregnancy and is a former pt of [facility].  Bedside U/S showed IUP with pos FHT of 178 bpm and gest age of 8w 4 d.  Prenatal labs drawn and pt is to return on 09/14/11.  There is a noted small inferior subchorionic hemorrhage on U/S.  Pt states that she had one with one of her other pregnancies but it resoved itself.    Pt is currently still breastfeeding 2 month old.

## 2011-08-25 LAB — OBSTETRIC PANEL
Antibody Screen: NEGATIVE
Basophils Absolute: 0 10*3/uL (ref 0.0–0.1)
Basophils Relative: 0 % (ref 0–1)
Eosinophils Absolute: 0 10*3/uL (ref 0.0–0.7)
Eosinophils Relative: 0 % (ref 0–5)
HCT: 44.6 % (ref 36.0–46.0)
Hemoglobin: 14.3 g/dL (ref 12.0–15.0)
MCH: 29.4 pg (ref 26.0–34.0)
MCHC: 32.1 g/dL (ref 30.0–36.0)
MCV: 91.6 fL (ref 78.0–100.0)
Monocytes Absolute: 0.4 10*3/uL (ref 0.1–1.0)
Monocytes Relative: 6 % (ref 3–12)
Neutro Abs: 4.8 10*3/uL (ref 1.7–7.7)
RDW: 14.4 % (ref 11.5–15.5)
Rh Type: POSITIVE

## 2011-08-25 LAB — HIV ANTIBODY (ROUTINE TESTING W REFLEX): HIV: NONREACTIVE

## 2011-08-26 LAB — CULTURE, URINE COMPREHENSIVE: Colony Count: 75000

## 2011-09-02 ENCOUNTER — Other Ambulatory Visit: Payer: Self-pay | Admitting: *Deleted

## 2011-09-02 DIAGNOSIS — O219 Vomiting of pregnancy, unspecified: Secondary | ICD-10-CM

## 2011-09-02 MED ORDER — ONDANSETRON HCL 8 MG PO TABS: 8.0000 mg | ORAL_TABLET | Freq: Three times a day (TID) | ORAL | Status: DC | PRN

## 2011-09-02 NOTE — Telephone Encounter (Signed)
Pt called requesting RF on Zofran 8mg .  RX called to [facility].  Pt requesting #30 one every 8 hr PRN.  Pt to use Colace daily also.

## 2011-09-10 ENCOUNTER — Ambulatory Visit (INDEPENDENT_AMBULATORY_CARE_PROVIDER_SITE_OTHER): Payer: 59 | Admitting: *Deleted

## 2011-09-10 DIAGNOSIS — Z23 Encounter for immunization: Secondary | ICD-10-CM

## 2011-09-14 ENCOUNTER — Ambulatory Visit (INDEPENDENT_AMBULATORY_CARE_PROVIDER_SITE_OTHER): Payer: 59 | Admitting: Advanced Practice Midwife

## 2011-09-14 ENCOUNTER — Encounter: Payer: Self-pay | Admitting: Advanced Practice Midwife

## 2011-09-14 VITALS — BP 106/64 | Temp 98.6°F | Wt 140.0 lb

## 2011-09-14 DIAGNOSIS — IMO0002 Reserved for concepts with insufficient information to code with codable children: Secondary | ICD-10-CM

## 2011-09-14 DIAGNOSIS — Z3480 Encounter for supervision of other normal pregnancy, unspecified trimester: Secondary | ICD-10-CM

## 2011-09-14 DIAGNOSIS — O09529 Supervision of elderly multigravida, unspecified trimester: Secondary | ICD-10-CM

## 2011-09-14 NOTE — Progress Notes (Signed)
   Subjective:    [name] is a [id number] [redacted]w[redacted]d being seen today for her first obstetrical visit.  Her obstetrical history is significant for advanced maternal age. Patient does intend to breast feed. Pregnancy history fully reviewed.  Patient reports vomiting.  Filed Vitals:   09/14/11 1332  BP: 106/64  Temp: 98.6 F (37 C)  Weight: 63.504 kg (140 lb)    HISTORY: OB History    Grav Para Term Preterm Abortions TAB SAB Ect Mult Living   5 4 4      1 5      # Outc Date GA Lbr Len/2nd Wgt Sex Del Anes PTL Lv   1 TRM 10/05 [redacted]w[redacted]d  8lb(3.629kg) M SVD EPI No Yes   2A TRM 7/07 [redacted]w[redacted]d  6lb11oz(3.033kg) M SVD EPI No Yes   2B  7/07 [redacted]w[redacted]d  6lb12oz(3.062kg) F SVD EPI No Yes   3 TRM 2/10 [redacted]w[redacted]d  7lb5oz(3.317kg) F SVD None No Yes   4 TRM 6/11 [redacted]w[redacted]d  7lb12oz(3.515kg) F SVD None No Yes   5 CUR              Past Medical History  Diagnosis Date  . Allergy     seasonal  . ANXIETY DEPRESSION 11/21/2010  . ALLERGIC RHINITIS 11/21/2010  . Other specified anemias 11/21/2010  . Anxiety   . Asthma    History reviewed. No pertinent past surgical history. Family History  Problem Relation Age of Onset  . Hyperlipidemia Mother   . Osteoarthritis Mother   . Diabetes Father     Type 2  . Ulcers Father   . Migraines Sister   . Bipolar disorder Brother   . Panic disorder Brother   . Arthritis Maternal Grandmother   . Cancer Maternal Grandmother     uterine  . Cancer Maternal Grandfather   . Heart disease Maternal Grandfather   . Heart attack Maternal Grandfather   . Diabetes Paternal Grandmother   . Hypertension Paternal Grandmother   . Cancer Paternal Grandfather     multiple myeloma     Exam    Uterine Size: size equals dates  System: Breast:  normal appearance, no masses or tenderness   Skin: normal coloration and turgor, no rashes    Neurologic: oriented, normal   Extremities: normal strength, tone, and muscle mass   Neck supple and no masses   Cardiovascular: regular rate  and rhythm   Respiratory:  appears well, vitals normal, no respiratory distress, acyanotic, normal RR   Abdomen: soft, non-tender; bowel sounds normal; no masses,  no organomegaly      Assessment:    Pregnancy: [id number] Patient Active Problem List  Diagnoses  . OTHER SPECIFIED ANEMIAS  . ANXIETY DEPRESSION  . ALLERGIC RHINITIS        Plan:  Continue Zofran PRN, recommended Colace if needed Prenatal labs drawn at last visit, normal Declines genetic testing, plan for referral to MFM for anatomy u/s for AMA F/U in 4 weeks   Initial labs drawn. Prenatal vitamins. Problem list reviewed and updated. Genetic Screening discussed Integrated Screen: declined.  Ultrasound discussed; fetal survey: requested.  Follow up in 4 weeks.   [name] 09/14/2011  Had normal pap last year - will request records from [facility].

## 2011-09-14 NOTE — Patient Instructions (Signed)
Pregnancy - First Trimester During sexual intercourse, millions of sperm go into the vagina. Only 1 sperm will penetrate and fertilize the female egg while it is in the Fallopian tube. One week later, the fertilized egg implants into the wall of the uterus. An embryo begins to develop into a baby. At 6 to 8 weeks, the eyes and face are formed and the heartbeat can be seen on ultrasound. At the end of 12 weeks (first trimester), all the baby's organs are formed. Now that you are pregnant, you will want to do everything you can to have a healthy baby. Two of the most important things are to get good prenatal care and follow your caregiver's instructions. Prenatal care is all the medical care you receive before the baby's birth. It is given to prevent, find, and treat problems during the pregnancy and childbirth. PRENATAL EXAMS  During prenatal visits, your weight, blood pressure and urine are checked. This is done to make sure you are healthy and progressing normally during the pregnancy.   A pregnant woman should gain 25 to 35 pounds during the pregnancy. However, if you are over weight or underweight, your caregiver will advise you regarding your weight.   Your caregiver will ask and answer questions for you.   Blood work, cervical cultures, other necessary tests and a Pap test are done during your prenatal exams. These tests are done to check on your health and the probable health of your baby. Tests are strongly recommended and done for HIV with your permission. This is the virus that causes AIDS. These tests are done because medications can be given to help prevent your baby from being born with this infection should you have been infected without knowing it. Blood work is also used to find out your blood type, previous infections and follow your blood levels (hemoglobin).   Low hemoglobin (anemia) is common during pregnancy. Iron and vitamins are given to help prevent this. Later in the pregnancy,  blood tests for diabetes will be done along with any other tests if any problems develop. You may need tests to make sure you and the baby are doing well.   You may need other tests to make sure you and the baby are doing well.  CHANGES DURING THE FIRST TRIMESTER (THE FIRST 3 MONTHS OF PREGNANCY) Your body goes through many changes during pregnancy. They vary from person to person. Talk to your caregiver about changes you notice and are concerned about. Changes can include:  Your menstrual period stops.   The egg and sperm carry the genes that determine what you look like. Genes from you and your partner are forming a baby. The female genes determine whether the baby is a boy or a girl.   Your body increases in girth and you may feel bloated.   Feeling sick to your stomach (nauseous) and throwing up (vomiting). If the vomiting is uncontrollable, call your caregiver.   Your breasts will begin to enlarge and become tender.   Your nipples may stick out more and become darker.   The need to urinate more. Painful urination may mean you have a bladder infection.   Tiring easily.   Loss of appetite.   Cravings for certain kinds of food.   At first, you may gain or lose a couple of pounds.   You may have changes in your emotions from day to day (excited to be pregnant or concerned something may go wrong with the pregnancy and baby).     You may have more vivid and strange dreams.  HOME CARE INSTRUCTIONS   It is very important to avoid all smoking, alcohol and un-prescribed drugs during your pregnancy. These affect the formation and growth of the baby. Avoid chemicals while pregnant to ensure the delivery of a healthy infant.   Start your prenatal visits by the 12th week of pregnancy. They are usually scheduled monthly at first, then more often in the last 2 months before delivery. Keep your caregiver's appointments. Follow your caregiver's instructions regarding medication use, blood and lab  tests, exercise, and diet.   During pregnancy, you are providing food for you and your baby. Eat regular, well-balanced meals. Choose foods such as meat, fish, milk and other low fat dairy products, vegetables, fruits, and whole-grain breads and cereals. Your caregiver will tell you of the ideal weight gain.   You can help morning sickness by keeping soda crackers at the bedside. Eat a couple before arising in the morning. You may want to use the crackers without salt on them.   Eating 4 to 5 small meals rather than 3 large meals a day also may help the nausea and vomiting.   Drinking liquids between meals instead of during meals also seems to help nausea and vomiting.   A physical sexual relationship may be continued throughout pregnancy if there are no other problems. Problems may be early (premature) leaking of amniotic fluid from the membranes, vaginal bleeding, or belly (abdominal) pain.   Exercise regularly if there are no restrictions. Check with your caregiver or physical therapist if you are unsure of the safety of some of your exercises. Greater weight gain will occur in the last 2 trimesters of pregnancy. Exercising will help:   Control your weight.   Keep you in shape.   Prepare you for labor and delivery.   Help you lose your pregnancy weight after you deliver your baby.   Wear a good support or jogging bra for breast tenderness during pregnancy. This may help if worn during sleep too.   Ask when prenatal classes are available. Begin classes when they are offered.   Do not use hot tubs, steam rooms or saunas.   Wear your seat belt when driving. This protects you and your baby if you are in an accident.   Avoid raw meat, uncooked cheese, cat litter boxes and soil used by cats throughout the pregnancy. These carry germs that can cause birth defects in the baby.   The first trimester is a good time to visit your dentist for your dental health. Getting your teeth cleaned is  OK. Use a softer toothbrush and brush gently during pregnancy.   Ask for help if you have financial, counseling or nutritional needs during pregnancy. Your caregiver will be able to offer counseling for these needs as well as refer you for other special needs.   Do not take any medications or herbs unless told by your caregiver.   Inform your caregiver if there is any mental or physical domestic violence.   Make a list of emergency phone numbers of family, friends, hospital, and police and fire departments.   Write down your questions. Take them to your prenatal visit.   Do not douche.   Do not cross your legs.   If you have to stand for long periods of time, rotate you feet or take small steps in a circle.   You may have more vaginal secretions that may require a sanitary pad. Do not use   tampons or scented sanitary pads.  MEDICATIONS AND DRUG USE IN PREGNANCY  Take prenatal vitamins as directed. The vitamin should contain 1 milligram of folic acid. Keep all vitamins out of reach of children. Only a couple vitamins or tablets containing iron may be fatal to a baby or young child when ingested.   Avoid use of all medications, including herbs, over-the-counter medications, not prescribed or suggested by your caregiver. Only take over-the-counter or prescription medicines for pain, discomfort, or fever as directed by your caregiver. Do not use aspirin, ibuprofen, or naproxen unless directed by your caregiver.   Let your caregiver also know about herbs you may be using.   Alcohol is related to a number of birth defects. This includes fetal alcohol syndrome. All alcohol, in any form, should be avoided completely. Smoking will cause low birth rate and premature babies.   Street or illegal drugs are very harmful to the baby. They are absolutely forbidden. A baby born to an addicted mother will be addicted at birth. The baby will go through the same withdrawal an adult does.   Let your  caregiver know about any medications that you have to take and for what reason you take them.  MISCARRIAGE IS COMMON DURING PREGNANCY A miscarriage does not mean you did something wrong. It is not a reason to worry about getting pregnant again. Your caregiver will help you with questions you may have. If you have a miscarriage, you may need minor surgery. SEEK MEDICAL CARE IF:  You have any concerns or worries during your pregnancy. It is better to call with your questions if you feel they cannot wait, rather than worry about them. SEEK IMMEDIATE MEDICAL CARE IF:   An unexplained oral temperature above 102 F (38.9 C) develops, or as your caregiver suggests.   You have leaking of fluid from the vagina (birth canal). If leaking membranes are suspected, take your temperature and inform your caregiver of this when you call.   There is vaginal spotting or bleeding. Notify your caregiver of the amount and how many pads are used.   You develop a bad smelling vaginal discharge with a change in the color.   You continue to feel sick to your stomach (nauseated) and have no relief from remedies suggested. You vomit blood or coffee ground-like materials.   You lose more than 2 pounds of weight in 1 week.   You gain more than 2 pounds of weight in 1 week and you notice swelling of your face, hands, feet, or legs.   You gain 5 pounds or more in 1 week (even if you do not have swelling of your hands, face, legs, or feet).   You get exposed to German measles and have never had them.   You are exposed to fifth disease or chickenpox.   You develop belly (abdominal) pain. Round ligament discomfort is a common non-cancerous (benign) cause of abdominal pain in pregnancy. Your caregiver still must evaluate this.   You develop headache, fever, diarrhea, pain with urination, or shortness of breath.   You fall or are in a car accident or have any kind of trauma.   There is mental or physical violence in  your home.  Document Released: 10/27/2001 Document Revised: 07/15/2011 Document Reviewed: 04/30/2009 [employer] Patient Information 2012 [employer]. 

## 2011-09-14 NOTE — Progress Notes (Signed)
p-[id number] 

## 2011-10-12 ENCOUNTER — Ambulatory Visit (INDEPENDENT_AMBULATORY_CARE_PROVIDER_SITE_OTHER): Payer: 59 | Admitting: Advanced Practice Midwife

## 2011-10-12 DIAGNOSIS — Z3480 Encounter for supervision of other normal pregnancy, unspecified trimester: Secondary | ICD-10-CM

## 2011-10-12 DIAGNOSIS — J019 Acute sinusitis, unspecified: Secondary | ICD-10-CM

## 2011-10-12 MED ORDER — AZITHROMYCIN 250 MG PO TABS: ORAL_TABLET | ORAL | Status: AC

## 2011-10-12 NOTE — Progress Notes (Signed)
p-[id number]  Poss sinus infection, headache, face pain, teeth ache.

## 2011-10-12 NOTE — Progress Notes (Signed)
Sinus pain and pressure x 3 weeks. Copious green post-nasal drip. No fever. Will try mucinex and nasal irrigation x 3 days, then Z-pack PRN. Increase PO fluids. Declines genetic screening. Anatomy US scheduled for 19 weeks.

## 2011-10-12 NOTE — Patient Instructions (Signed)

## 2011-10-22 ENCOUNTER — Other Ambulatory Visit: Payer: Self-pay | Admitting: Family Medicine

## 2011-11-11 ENCOUNTER — Ambulatory Visit (HOSPITAL_COMMUNITY)
Admission: RE | Admit: 2011-11-11 | Discharge: 2011-11-11 | Disposition: A | Discharge status: Home / Self Care | Payer: 59 | Source: Ambulatory Visit | Attending: Advanced Practice Midwife | Admitting: Advanced Practice Midwife

## 2011-11-11 DIAGNOSIS — Z1389 Encounter for screening for other disorder: Secondary | ICD-10-CM | POA: Insufficient documentation

## 2011-11-11 DIAGNOSIS — O358XX0 Maternal care for other (suspected) fetal abnormality and damage, not applicable or unspecified: Secondary | ICD-10-CM | POA: Insufficient documentation

## 2011-11-11 DIAGNOSIS — O09529 Supervision of elderly multigravida, unspecified trimester: Secondary | ICD-10-CM | POA: Insufficient documentation

## 2011-11-11 DIAGNOSIS — Z363 Encounter for antenatal screening for malformations: Secondary | ICD-10-CM | POA: Insufficient documentation

## 2011-11-11 DIAGNOSIS — Z3480 Encounter for supervision of other normal pregnancy, unspecified trimester: Secondary | ICD-10-CM

## 2011-11-17 NOTE — L&D Delivery Note (Signed)
Delivery Note At 7:22 AM a viable female was delivered via Vaginal, Spontaneous Delivery (Presentation: right Occiput Anterior). No Nuchal APGAR: 7, 9; weight .Placenta status: Intact, Spontaneous by Veatrice Kells.  Cord:3V  with the following complications: None.  Anesthesia: None  Episiotomy: None Lacerations: None Suture Repair: None Est. Blood Loss (mL): 300cc  Mom to postpartum.  Baby in room.  Allaya Abbasi E. 03/04/2012, 8:07 AM

## 2011-11-18 ENCOUNTER — Encounter: Payer: 59 | Admitting: Obstetrics & Gynecology

## 2011-11-30 ENCOUNTER — Ambulatory Visit (INDEPENDENT_AMBULATORY_CARE_PROVIDER_SITE_OTHER): Payer: 59 | Admitting: Advanced Practice Midwife

## 2011-11-30 DIAGNOSIS — Z3480 Encounter for supervision of other normal pregnancy, unspecified trimester: Secondary | ICD-10-CM

## 2011-11-30 DIAGNOSIS — O09529 Supervision of elderly multigravida, unspecified trimester: Secondary | ICD-10-CM

## 2011-11-30 NOTE — Patient Instructions (Signed)
Pregnancy - Second Trimester The second trimester of pregnancy (3 to 6 months) is a period of rapid growth for you and your baby. At the end of the sixth month, your baby is about 9 inches long and weighs 1 1/2 pounds. You will begin to feel the baby move between 18 and 20 weeks of the pregnancy. This is called quickening. Weight gain is faster. A clear fluid (colostrum) may leak out of your breasts. You may feel small contractions of the womb (uterus). This is known as false labor or Braxton-Hicks contractions. This is like a practice for labor when the baby is ready to be born. Usually, the problems with morning sickness have usually passed by the end of your first trimester. Some women develop small dark blotches (called cholasma, mask of pregnancy) on their face that usually goes away after the baby is born. Exposure to the sun makes the blotches worse. Acne may also develop in some pregnant women and pregnant women who have acne, may find that it goes away. PRENATAL EXAMS  Blood work may continue to be done during prenatal exams. These tests are done to check on your health and the probable health of your baby. Blood work is used to follow your blood levels (hemoglobin). Anemia (low hemoglobin) is common during pregnancy. Iron and vitamins are given to help prevent this. You will also be checked for diabetes between 24 and 28 weeks of the pregnancy. Some of the previous blood tests may be repeated.   The size of the uterus is measured during each visit. This is to make sure that the baby is continuing to grow properly according to the dates of the pregnancy.   Your blood pressure is checked every prenatal visit. This is to make sure you are not getting toxemia.   Your urine is checked to make sure you do not have an infection, diabetes or protein in the urine.   Your weight is checked often to make sure gains are happening at the suggested rate. This is to ensure that both you and your baby are  growing normally.   Sometimes, an ultrasound is performed to confirm the proper growth and development of the baby. This is a test which bounces harmless sound waves off the baby so your caregiver can more accurately determine due dates.  Sometimes, a specialized test is done on the amniotic fluid surrounding the baby. This test is called an amniocentesis. The amniotic fluid is obtained by sticking a needle into the belly (abdomen). This is done to check the chromosomes in instances where there is a concern about possible genetic problems with the baby. It is also sometimes done near the end of pregnancy if an early delivery is required. In this case, it is done to help make sure the baby's lungs are mature enough for the baby to live outside of the womb. CHANGES OCCURING IN THE SECOND TRIMESTER OF PREGNANCY Your body goes through many changes during pregnancy. They vary from person to person. Talk to your caregiver about changes you notice that you are concerned about.  During the second trimester, you will likely have an increase in your appetite. It is normal to have cravings for certain foods. This varies from person to person and pregnancy to pregnancy.   Your lower abdomen will begin to bulge.   You may have to urinate more often because the uterus and baby are pressing on your bladder. It is also common to get more bladder infections during pregnancy (  pain with urination). You can help this by drinking lots of fluids and emptying your bladder before and after intercourse.   You may begin to get stretch marks on your hips, abdomen, and breasts. These are normal changes in the body during pregnancy. There are no exercises or medications to take that prevent this change.   You may begin to develop swollen and bulging veins (varicose veins) in your legs. Wearing support hose, elevating your feet for 15 minutes, 3 to 4 times a day and limiting salt in your diet helps lessen the problem.    Heartburn may develop as the uterus grows and pushes up against the stomach. Antacids recommended by your caregiver helps with this problem. Also, eating smaller meals 4 to 5 times a day helps.   Constipation can be treated with a stool softener or adding bulk to your diet. Drinking lots of fluids, vegetables, fruits, and whole grains are helpful.   Exercising is also helpful. If you have been very active up until your pregnancy, most of these activities can be continued during your pregnancy. If you have been less active, it is helpful to start an exercise program such as walking.   Hemorrhoids (varicose veins in the rectum) may develop at the end of the second trimester. Warm sitz baths and hemorrhoid cream recommended by your caregiver helps hemorrhoid problems.   Backaches may develop during this time of your pregnancy. Avoid heavy lifting, wear low heal shoes and practice good posture to help with backache problems.   Some pregnant women develop tingling and numbness of their hand and fingers because of swelling and tightening of ligaments in the wrist (carpel tunnel syndrome). This goes away after the baby is born.   As your breasts enlarge, you may have to get a bigger bra. Get a comfortable, cotton, support bra. Do not get a nursing bra until the last month of the pregnancy if you will be nursing the baby.   You may get a dark line from your belly button to the pubic area called the linea nigra.   You may develop rosy cheeks because of increase blood flow to the face.   You may develop spider looking lines of the face, neck, arms and chest. These go away after the baby is born.  HOME CARE INSTRUCTIONS   It is extremely important to avoid all smoking, herbs, alcohol, and unprescribed drugs during your pregnancy. These chemicals affect the formation and growth of the baby. Avoid these chemicals throughout the pregnancy to ensure the delivery of a healthy infant.   Most of your home  care instructions are the same as suggested for the first trimester of your pregnancy. Keep your caregiver's appointments. Follow your caregiver's instructions regarding medication use, exercise and diet.   During pregnancy, you are providing food for you and your baby. Continue to eat regular, well-balanced meals. Choose foods such as meat, fish, milk and other low fat dairy products, vegetables, fruits, and whole-grain breads and cereals. Your caregiver will tell you of the ideal weight gain.   A physical sexual relationship may be continued up until near the end of pregnancy if there are no other problems. Problems could include early (premature) leaking of amniotic fluid from the membranes, vaginal bleeding, abdominal pain, or other medical or pregnancy problems.   Exercise regularly if there are no restrictions. Check with your caregiver if you are unsure of the safety of some of your exercises. The greatest weight gain will occur in the   last 2 trimesters of pregnancy. Exercise will help you:   Control your weight.   Get you in shape for labor and delivery.   Lose weight after you have the baby.   Wear a good support or jogging bra for breast tenderness during pregnancy. This may help if worn during sleep. Pads or tissues may be used in the bra if you are leaking colostrum.   Do not use hot tubs, steam rooms or saunas throughout the pregnancy.   Wear your seat belt at all times when driving. This protects you and your baby if you are in an accident.   Avoid raw meat, uncooked cheese, cat litter boxes and soil used by cats. These carry germs that can cause birth defects in the baby.   The second trimester is also a good time to visit your dentist for your dental health if this has not been done yet. Getting your teeth cleaned is OK. Use a soft toothbrush. Brush gently during pregnancy.   It is easier to loose urine during pregnancy. Tightening up and strengthening the pelvic muscles will  help with this problem. Practice stopping your urination while you are going to the bathroom. These are the same muscles you need to strengthen. It is also the muscles you would use as if you were trying to stop from passing gas. You can practice tightening these muscles up 10 times a set and repeating this about 3 times per day. Once you know what muscles to tighten up, do not perform these exercises during urination. It is more likely to contribute to an infection by backing up the urine.   Ask for help if you have financial, counseling or nutritional needs during pregnancy. Your caregiver will be able to offer counseling for these needs as well as refer you for other special needs.   Your skin may become oily. If so, wash your face with mild soap, use non-greasy moisturizer and oil or cream based makeup.  MEDICATIONS AND DRUG USE IN PREGNANCY  Take prenatal vitamins as directed. The vitamin should contain 1 milligram of folic acid. Keep all vitamins out of reach of children. Only a couple vitamins or tablets containing iron may be fatal to a baby or young child when ingested.   Avoid use of all medications, including herbs, over-the-counter medications, not prescribed or suggested by your caregiver. Only take over-the-counter or prescription medicines for pain, discomfort, or fever as directed by your caregiver. Do not use aspirin.   Let your caregiver also know about herbs you may be using.   Alcohol is related to a number of birth defects. This includes fetal alcohol syndrome. All alcohol, in any form, should be avoided completely. Smoking will cause low birth rate and premature babies.   Street or illegal drugs are very harmful to the baby. They are absolutely forbidden. A baby born to an addicted mother will be addicted at birth. The baby will go through the same withdrawal an adult does.  SEEK MEDICAL CARE IF:  You have any concerns or worries during your pregnancy. It is better to call with  your questions if you feel they cannot wait, rather than worry about them. SEEK IMMEDIATE MEDICAL CARE IF:   An unexplained oral temperature above 102 F (38.9 C) develops, or as your caregiver suggests.   You have leaking of fluid from the vagina (birth canal). If leaking membranes are suspected, take your temperature and tell your caregiver of this when you call.   There   is vaginal spotting, bleeding, or passing clots. Tell your caregiver of the amount and how many pads are used. Light spotting in pregnancy is common, especially following intercourse.   You develop a bad smelling vaginal discharge with a change in the color from clear to white.   You continue to feel sick to your stomach (nauseated) and have no relief from remedies suggested. You vomit blood or coffee ground-like materials.   You lose more than 2 pounds of weight or gain more than 2 pounds of weight over 1 week, or as suggested by your caregiver.   You notice swelling of your face, hands, feet, or legs.   You get exposed to German measles and have never had them.   You are exposed to fifth disease or chickenpox.   You develop belly (abdominal) pain. Round ligament discomfort is a common non-cancerous (benign) cause of abdominal pain in pregnancy. Your caregiver still must evaluate you.   You develop a bad headache that does not go away.   You develop fever, diarrhea, pain with urination, or shortness of breath.   You develop visual problems, blurry, or double vision.   You fall or are in a car accident or any kind of trauma.   There is mental or physical violence at home.  Document Released: 10/27/2001 Document Revised: 07/15/2011 Document Reviewed: 05/01/2009 [employer] Patient Information 2012 [employer]. 

## 2011-11-30 NOTE — Progress Notes (Signed)
p=105 

## 2011-11-30 NOTE — Progress Notes (Signed)
Feeling well, sinus pain and drainage resolved. No c/o today. 1 hour GCT at next visit.

## 2011-12-30 ENCOUNTER — Encounter: Payer: 59 | Admitting: Obstetrics & Gynecology

## 2012-01-08 ENCOUNTER — Ambulatory Visit (INDEPENDENT_AMBULATORY_CARE_PROVIDER_SITE_OTHER): Payer: 59 | Admitting: Family

## 2012-01-08 VITALS — BP 119/67 | Temp 98.3°F | Wt 164.0 lb

## 2012-01-08 DIAGNOSIS — O09529 Supervision of elderly multigravida, unspecified trimester: Secondary | ICD-10-CM

## 2012-01-08 NOTE — Progress Notes (Signed)
p-98 

## 2012-01-08 NOTE — Progress Notes (Signed)
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

## 2012-01-09 ENCOUNTER — Encounter: Payer: Self-pay | Admitting: Family

## 2012-01-09 LAB — HIV ANTIBODY (ROUTINE TESTING W REFLEX): HIV: NONREACTIVE

## 2012-01-17 ENCOUNTER — Emergency Department (INDEPENDENT_AMBULATORY_CARE_PROVIDER_SITE_OTHER): Payer: 59

## 2012-01-17 ENCOUNTER — Encounter (HOSPITAL_BASED_OUTPATIENT_CLINIC_OR_DEPARTMENT_OTHER): Payer: Self-pay | Admitting: *Deleted

## 2012-01-17 ENCOUNTER — Emergency Department (HOSPITAL_BASED_OUTPATIENT_CLINIC_OR_DEPARTMENT_OTHER)
Admission: EM | Admit: 2012-01-17 | Discharge: 2012-01-17 | Disposition: A | Discharge status: Home / Self Care | Payer: 59 | Attending: Emergency Medicine | Admitting: Emergency Medicine

## 2012-01-17 ENCOUNTER — Other Ambulatory Visit: Payer: Self-pay | Admitting: Family Medicine

## 2012-01-17 DIAGNOSIS — Z79899 Other long term (current) drug therapy: Secondary | ICD-10-CM | POA: Insufficient documentation

## 2012-01-17 DIAGNOSIS — R110 Nausea: Secondary | ICD-10-CM | POA: Insufficient documentation

## 2012-01-17 DIAGNOSIS — M2669 Other specified disorders of temporomandibular joint: Secondary | ICD-10-CM | POA: Insufficient documentation

## 2012-01-17 DIAGNOSIS — J45909 Unspecified asthma, uncomplicated: Secondary | ICD-10-CM | POA: Insufficient documentation

## 2012-01-17 DIAGNOSIS — M26629 Arthralgia of temporomandibular joint, unspecified side: Secondary | ICD-10-CM

## 2012-01-17 DIAGNOSIS — R6884 Jaw pain: Secondary | ICD-10-CM

## 2012-01-17 DIAGNOSIS — K006 Disturbances in tooth eruption: Secondary | ICD-10-CM | POA: Insufficient documentation

## 2012-01-17 DIAGNOSIS — F341 Dysthymic disorder: Secondary | ICD-10-CM | POA: Insufficient documentation

## 2012-01-17 DIAGNOSIS — O9989 Other specified diseases and conditions complicating pregnancy, childbirth and the puerperium: Secondary | ICD-10-CM | POA: Insufficient documentation

## 2012-01-17 DIAGNOSIS — Z331 Pregnant state, incidental: Secondary | ICD-10-CM

## 2012-01-17 DIAGNOSIS — K011 Impacted teeth: Secondary | ICD-10-CM

## 2012-01-17 LAB — URINALYSIS, ROUTINE W REFLEX MICROSCOPIC
Bilirubin Urine: NEGATIVE
Hgb urine dipstick: NEGATIVE
Ketones, ur: NEGATIVE mg/dL
Protein, ur: NEGATIVE mg/dL
Urobilinogen, UA: 0.2 mg/dL (ref 0.0–1.0)

## 2012-01-17 LAB — CBC
HCT: 31 % — ABNORMAL LOW (ref 36.0–46.0)
Platelets: 158 10*3/uL (ref 150–400)
RBC: 3.62 MIL/uL — ABNORMAL LOW (ref 3.87–5.11)
RDW: 14.4 % (ref 11.5–15.5)
WBC: 5.4 10*3/uL (ref 4.0–10.5)

## 2012-01-17 LAB — DIFFERENTIAL
Basophils Absolute: 0 10*3/uL (ref 0.0–0.1)
Lymphocytes Relative: 19 % (ref 12–46)
Lymphs Abs: 1 10*3/uL (ref 0.7–4.0)
Monocytes Absolute: 0.5 10*3/uL (ref 0.1–1.0)
Neutro Abs: 3.9 10*3/uL (ref 1.7–7.7)

## 2012-01-17 LAB — COMPREHENSIVE METABOLIC PANEL
ALT: 5 U/L (ref 0–35)
AST: 13 U/L (ref 0–37)
CO2: 24 mEq/L (ref 19–32)
Chloride: 104 mEq/L (ref 96–112)
GFR calc non Af Amer: >90 mL/min (ref >90)
Sodium: 137 mEq/L (ref 135–145)
Total Bilirubin: 0.1 mg/dL — ABNORMAL LOW (ref 0.3–1.2)

## 2012-01-17 MED ORDER — OXYCODONE-ACETAMINOPHEN 5-325 MG PO TABS
2.0000 | ORAL_TABLET | ORAL | Status: AC | PRN
Start: 2012-01-17 — End: 2012-01-27

## 2012-01-17 MED ORDER — OXYCODONE-ACETAMINOPHEN 5-325 MG PO TABS
2.0000 | ORAL_TABLET | Freq: Once | ORAL | Status: AC
  Administered 2012-01-17: 2 via ORAL
  Filled 2012-01-17: qty 2

## 2012-01-17 NOTE — ED Provider Notes (Signed)
History   This chart was scribed for Susan Bonier, MD by Melba Coon. The patient was seen in room MHT14/M[facility]14 and the patient's care was started at 7:29PM.    CSN: [id number]  Arrival date & time 01/17/12  1752   First MD Initiated Contact with Patient 01/17/12 1829      Chief Complaint  Patient presents with  . Jaw Pain    (Consider location/radiation/quality/duration/timing/severity/associated sxs/prior treatment) HPI [name] is a 36 y.o. female who presents to the Emergency Department complaining of intermittent, dull, aching, stabbing moderate to severe lower jaw pain with an onset a month ago. Pt states that pain comes in waves and has been getting progressively worse since onset. Pt rates the severity of the pain 7/10. Pt thinks that it is TMJ syndrome. Left back lower wisdom tooth has been causing her problems; whenever she flosses, the tooth breaks the floss. Tylenol does not alleviate the pain. Nausea present. No CP, SOB or extremity edema; no change in vision or hearing. Pt is 4 months pregnant; this is her 5th child; no complications with pregnancy. No known allergies. No other pertinent medical symptoms.  Past Medical History  Diagnosis Date  . Allergy     seasonal  . ANXIETY DEPRESSION 11/21/2010  . ALLERGIC RHINITIS 11/21/2010  . Other specified anemias 11/21/2010  . Anxiety   . Asthma     History reviewed. No pertinent past surgical history.  Family History  Problem Relation Age of Onset  . Hyperlipidemia Mother   . Osteoarthritis Mother   . Diabetes Father     Type 2  . Ulcers Father   . Migraines Sister   . Bipolar disorder Brother   . Panic disorder Brother   . Arthritis Maternal Grandmother   . Cancer Maternal Grandmother     uterine  . Cancer Maternal Grandfather   . Heart disease Maternal Grandfather   . Heart attack Maternal Grandfather   . Diabetes Paternal Grandmother   . Hypertension Paternal Grandmother   . Cancer Paternal  Grandfather     multiple myeloma    History  Substance Use Topics  . Smoking status: Never Smoker   . Smokeless tobacco: Never Used  . Alcohol Use: 0.0 oz/week    0 drink(s) per week     rare occasional glass of wine    OB History    Grav Para Term Preterm Abortions TAB SAB Ect Mult Living   5 4 4      1 5       Review of Systems 10 Systems reviewed and are negative for acute change except as noted in the HPI.  Allergies  Review of patient's allergies indicates no known allergies.  Home Medications   Current Outpatient Rx  Name Route Sig Dispense Refill  . ACETAMINOPHEN ER 650 MG PO TBCR Oral Take 1,300 mg by mouth every 8 (eight) hours as needed. For pain    . ALBUTEROL SULFATE HFA 108 (90 BASE) MCG/ACT IN AERS Inhalation Inhale 2 puffs into the lungs every 6 (six) hours as needed. For wheezing     . CICLESONIDE 50 MCG/ACT NA SUSP Each Nare Place 2 sprays into both nostrils daily.     . DEXLANSOPRAZOLE 60 MG PO CPDR Oral Take 60 mg by mouth daily.      . VENLAFAXINE HCL ER 150 MG PO CP24  TAKE ONE CAPSULE BY MOUTH EVERY DAY 30 capsule 1    BP 108/64  Pulse 74  Temp(Src) 98.7  F (37.1 C) (Oral)  Resp 20  Ht 5\' 9"  (1.753 m)  Wt 160 lb (72.576 kg)  BMI 23.63 kg/m2  SpO2 100%  LMP 06/28/2011  Physical Exam  Nursing note and vitals reviewed. Constitutional: She appears well-developed and well-nourished.       Awake, alert, nontoxic appearance.  HENT:  Head: Normocephalic and atraumatic.  Right Ear: External ear normal.  Left Ear: External ear normal.  Mouth/Throat: Oropharynx is clear and moist.       Mild tenderness over temperomandibular joint; mild quaking at the jaw; some tenderness around the masseter muscle.  Dentition nml; some fillings in place at left molars, no apparent fluctuance, swelling or erythmea to suggest infection.  Eyes: Conjunctivae and EOM are normal. Pupils are equal, round, and reactive to light. Right eye exhibits no discharge. Left eye  exhibits no discharge.       Fundoscopic exam nml with no papilla edema.  Neck: Normal range of motion. Neck supple.  Cardiovascular: Normal rate, regular rhythm and normal heart sounds.  Exam reveals no gallop and no friction rub.   No murmur heard. Pulmonary/Chest: Effort normal and breath sounds normal. No respiratory distress. She has no wheezes. She has no rales. She exhibits no tenderness.  Abdominal: Soft. Bowel sounds are normal. She exhibits no distension. There is no tenderness. There is no rebound and no guarding.  Musculoskeletal: She exhibits no tenderness.       Baseline ROM, no obvious new focal weakness. No cervical spine tenderness.  Lymphadenopathy:    She has no cervical adenopathy.  Neurological:       Mental status and motor strength appears baseline for patient and situation. Peripheral visual fields nml intact to confrontation. Patellar DTRs 2+ bilaterally.  Skin: Skin is warm and dry. No rash noted.  Psychiatric: She has a normal mood and affect.    ED Course  Procedures (including critical care time)  DIAGNOSTIC STUDIES: Oxygen Saturation is 100% on room air, normal by my interpretation.    COORDINATION OF CARE:  Results for orders placed during the hospital encounter of 01/17/12  CBC      Component Value Range   WBC 5.4  4.0 - 10.5 (K/uL)   RBC 3.62 (*) 3.87 - 5.11 (MIL/uL)   Hemoglobin 10.5 (*) 12.0 - 15.0 (g/dL)   HCT 40.9 (*) 81.1 - 46.0 (%)   MCV 85.6  78.0 - 100.0 (fL)   MCH 29.0  26.0 - 34.0 (pg)   MCHC 33.9  30.0 - 36.0 (g/dL)   RDW 91.4  78.2 - 95.6 (%)   Platelets 158  150 - 400 (K/uL)  DIFFERENTIAL      Component Value Range   Neutrophils Relative 72  43 - 77 (%)   Neutro Abs 3.9  1.7 - 7.7 (K/uL)   Lymphocytes Relative 19  12 - 46 (%)   Lymphs Abs 1.0  0.7 - 4.0 (K/uL)   Monocytes Relative 9  3 - 12 (%)   Monocytes Absolute 0.5  0.1 - 1.0 (K/uL)   Eosinophils Relative 0  0 - 5 (%)   Eosinophils Absolute 0.0  0.0 - 0.7 (K/uL)    Basophils Relative 0  0 - 1 (%)   Basophils Absolute 0.0  0.0 - 0.1 (K/uL)  COMPREHENSIVE METABOLIC PANEL      Component Value Range   Sodium 137  135 - 145 (mEq/L)   Potassium 3.9  3.5 - 5.1 (mEq/L)   Chloride 104  96 - 112 (  mEq/L)   CO2 24  19 - 32 (mEq/L)   Glucose, Bld 113 (*) 70 - 99 (mg/dL)   BUN 8  6 - 23 (mg/dL)   Creatinine, Ser 0.45  0.50 - 1.10 (mg/dL)   Calcium 9.0  8.4 - 40.9 (mg/dL)   Total Protein 6.3  6.0 - 8.3 (g/dL)   Albumin 3.0 (*) 3.5 - 5.2 (g/dL)   AST 13  0 - 37 (U/L)   ALT 5  0 - 35 (U/L)   Alkaline Phosphatase 61  39 - 117 (U/L)   Total Bilirubin 0.1 (*) 0.3 - 1.2 (mg/dL)   GFR calc non Af Amer >90  >90 (mL/min)   GFR calc Af Amer >90  >90 (mL/min)  URINALYSIS, ROUTINE W REFLEX MICROSCOPIC      Component Value Range   Color, Urine YELLOW  YELLOW    APPearance CLEAR  CLEAR    Specific Gravity, Urine 1.010  1.005 - 1.030    pH 7.0  5.0 - 8.0    Glucose, UA NEGATIVE  NEGATIVE (mg/dL)   Hgb urine dipstick NEGATIVE  NEGATIVE    Bilirubin Urine NEGATIVE  NEGATIVE    Ketones, ur NEGATIVE  NEGATIVE (mg/dL)   Protein, ur NEGATIVE  NEGATIVE (mg/dL)   Urobilinogen, UA 0.2  0.0 - 1.0 (mg/dL)   Nitrite NEGATIVE  NEGATIVE    Leukocytes, UA MODERATE (*) NEGATIVE   URINE MICROSCOPIC-ADD ON      Component Value Range   Squamous Epithelial / LPF MANY (*) RARE    WBC, UA 7-10  <3 (WBC/hpf)   Bacteria, UA FEW (*) RARE     Dg Mandible 4 Views  01/17/2012  *RADIOLOGY REPORT*  Clinical Data: Progressive left-sided mandible pain for 1 month.  MANDIBLE - 4+ VIEW  Comparison: None.  Findings: The mandible appears normal.  Temporomandibular joints appear normal.  There is no evidence of periapical abscess or other acute abnormality of the teeth of the mandible.  Maxilla also appears normal.  Maxillary sinuses are clear.  IMPRESSION: Normal mandible.  Original Report Authenticated By: Gwynn Burly, M.D.     No diagnosis found.    MDM    Differential Diagnosis: TMJ  syndrome vs. trigeminal neuralgia    10:10 PM I called and spoke with the patient's OB/GYN, Dr. Penne Lash who the patient had contacted prior to coming to the emergency department. Dr. Penne Lash was worried that the patient may be having headache and possible preeclampsia. The patient is 29 weeks into her fifth pregnancy. So far she has had no difficulties. The patient denies any visual change, she denies headache, auditory change, extremity edema or other symptoms other than left jaw pain. She does not appear to have any signs of preeclampsia on the laboratory studies her physical examination. I discussed the patient's blood pressure and the rest of her findings with both Dr. Penne Lash and the patient, considering that my impression is not that of preeclampsia. Dr. Penne Lash concurs. I did this time have had the patient put on the fetal heart monitor and Dr. Penne Lash has evaluated the fetal heart tones from [facility], reporting that they are reassuring in suggestive of a healthy pregnancy. Dr. Penne Lash advises that Percocet would be a suitable pain medication for the patient if she needs something stronger than Tylenol. I conveyed this to the patient and her husband and they agree to this plan. I will discharge the patient home and I have instructed her to followup with dentistry for evaluation of  impacted left lower wisdom tooth seen on x-ray as the possible cause of her pain, or with ear nose and throat for evaluation of possible TMJ as a cause.  I personally performed the services described in this documentation, which was scribed in my presence. The recorded information has been reviewed and considered.   Manus Rudd, MD 01/17/12 2213

## 2012-01-17 NOTE — ED Notes (Signed)
MD at bedside. 

## 2012-01-17 NOTE — Progress Notes (Signed)
Spoke w/ Lupita Leash, RN and gave update on tracing and that Dr. Penne Lash aware.

## 2012-01-17 NOTE — Progress Notes (Signed)
Spoke w/ Lupita Leash, RN @ [facility] who states that pt is in c/o jaw pain.  Pt to be placed on monitor and further information r/t pregnancy collected.  Will return call w/ FHR tracing assessment.

## 2012-01-17 NOTE — ED Notes (Signed)
Pt c/o intermittent episodes of jaw pain x 1 month-

## 2012-01-20 ENCOUNTER — Encounter: Payer: Self-pay | Admitting: *Deleted

## 2012-01-20 LAB — URINE CULTURE: Colony Count: 25000

## 2012-01-21 NOTE — ED Notes (Signed)
+   urine Chart sent to EDP office for review. 

## 2012-01-23 NOTE — ED Notes (Signed)
Chart returned from EDP. Prescribed Nitrofurantoin 100 mg. One tablet PO Q 12 hrs X 7 days. Prescribed by Doran Durand PA-C.

## 2012-01-24 NOTE — ED Notes (Signed)
Attempted to call patient. No answer. Left message asking for patient to call back.

## 2012-01-25 ENCOUNTER — Other Ambulatory Visit: Payer: Self-pay | Admitting: Family Medicine

## 2012-01-25 NOTE — ED Notes (Signed)
Voice mail message left for patient to return call. 

## 2012-01-26 NOTE — ED Notes (Signed)
Copy of labs faxed to obgyn per request of husband.  Fax number [phone].Refused rx.

## 2012-02-02 ENCOUNTER — Ambulatory Visit (INDEPENDENT_AMBULATORY_CARE_PROVIDER_SITE_OTHER): Payer: 59 | Admitting: Obstetrics & Gynecology

## 2012-02-02 VITALS — BP 106/64 | Temp 98.4°F | Wt 164.0 lb

## 2012-02-02 DIAGNOSIS — Z3480 Encounter for supervision of other normal pregnancy, unspecified trimester: Secondary | ICD-10-CM

## 2012-02-02 NOTE — Progress Notes (Signed)
p-102  Wisdom tooth extracted last week., Urine shows small ketones, small blood and moderate leukocytes

## 2012-02-02 NOTE — Progress Notes (Signed)
Routine visit. No problems. I will check a urine culture although she denies any UTI sxs. Good FM, No VB, CTX, or ROM.

## 2012-02-06 LAB — URINE CULTURE

## 2012-02-16 ENCOUNTER — Ambulatory Visit (INDEPENDENT_AMBULATORY_CARE_PROVIDER_SITE_OTHER): Payer: 59 | Admitting: Obstetrics & Gynecology

## 2012-02-16 VITALS — BP 126/71 | Temp 98.5°F | Wt 168.0 lb

## 2012-02-16 DIAGNOSIS — Z3400 Encounter for supervision of normal first pregnancy, unspecified trimester: Secondary | ICD-10-CM

## 2012-02-16 DIAGNOSIS — O09519 Supervision of elderly primigravida, unspecified trimester: Secondary | ICD-10-CM

## 2012-02-16 MED ORDER — NITROFURANTOIN MONOHYD MACRO 100 MG PO CAPS: 100.0000 mg | ORAL_CAPSULE | Freq: Two times a day (BID) | ORAL | Status: AC

## 2012-02-16 NOTE — Progress Notes (Signed)
Routine visit. Macrobid for UTI. Good FM, No VB, ROM, CTXs.

## 2012-02-16 NOTE — Progress Notes (Signed)
P-107, urine moderate leukocytes

## 2012-03-01 ENCOUNTER — Encounter: Payer: Self-pay | Admitting: Obstetrics & Gynecology

## 2012-03-01 ENCOUNTER — Ambulatory Visit (INDEPENDENT_AMBULATORY_CARE_PROVIDER_SITE_OTHER): Payer: 59 | Admitting: Obstetrics & Gynecology

## 2012-03-01 VITALS — BP 106/67 | Temp 98.6°F | Wt 171.0 lb

## 2012-03-01 DIAGNOSIS — Z331 Pregnant state, incidental: Secondary | ICD-10-CM

## 2012-03-01 DIAGNOSIS — Z3480 Encounter for supervision of other normal pregnancy, unspecified trimester: Secondary | ICD-10-CM

## 2012-03-01 DIAGNOSIS — O09529 Supervision of elderly multigravida, unspecified trimester: Secondary | ICD-10-CM

## 2012-03-01 NOTE — Progress Notes (Signed)
p-100 

## 2012-03-01 NOTE — Progress Notes (Signed)
Cultures done today.  Pt has dental appt next Tuesday and would like to keep that appt.  Pt will return in 2 weeks.

## 2012-03-04 ENCOUNTER — Encounter (HOSPITAL_COMMUNITY): Payer: Self-pay | Admitting: Physician Assistant

## 2012-03-04 ENCOUNTER — Inpatient Hospital Stay (HOSPITAL_COMMUNITY)
Admission: AD | Admit: 2012-03-04 | Discharge: 2012-03-06 | DRG: 775 | Disposition: A | Discharge status: Home / Self Care | Payer: 59 | Source: Ambulatory Visit | Attending: Obstetrics & Gynecology | Admitting: Obstetrics & Gynecology

## 2012-03-04 DIAGNOSIS — O09529 Supervision of elderly multigravida, unspecified trimester: Principal | ICD-10-CM | POA: Diagnosis present

## 2012-03-04 DIAGNOSIS — O4700 False labor before 37 completed weeks of gestation, unspecified trimester: Secondary | ICD-10-CM

## 2012-03-04 LAB — CBC
HCT: 33.1 % — ABNORMAL LOW (ref 36.0–46.0)
Hemoglobin: 10.4 g/dL — ABNORMAL LOW (ref 12.0–15.0)
MCH: 24.8 pg — ABNORMAL LOW (ref 26.0–34.0)
MCHC: 31.4 g/dL (ref 30.0–36.0)
MCV: 79 fL (ref 78.0–100.0)
Platelets: 138 10*3/uL — ABNORMAL LOW (ref 150–400)
RBC: 4.19 MIL/uL (ref 3.87–5.11)
RDW: 15 % (ref 11.5–15.5)
WBC: 9.1 10*3/uL (ref 4.0–10.5)

## 2012-03-04 LAB — RPR: RPR Ser Ql: NONREACTIVE

## 2012-03-04 LAB — CULTURE, BETA STREP (GROUP B ONLY)

## 2012-03-04 MED ORDER — LANOLIN HYDROUS EX OINT: TOPICAL_OINTMENT | CUTANEOUS | Status: DC | PRN

## 2012-03-04 MED ORDER — ZOLPIDEM TARTRATE 5 MG PO TABS: 5.0000 mg | ORAL_TABLET | Freq: Every evening | ORAL | Status: DC | PRN

## 2012-03-04 MED ORDER — IBUPROFEN 600 MG PO TABS: 600.0000 mg | ORAL_TABLET | Freq: Four times a day (QID) | ORAL | Status: DC | PRN

## 2012-03-04 MED ORDER — OXYTOCIN 20 UNITS IN LACTATED RINGERS INFUSION - SIMPLE: 125.0000 mL/h | Freq: Once | INTRAVENOUS | Status: DC

## 2012-03-04 MED ORDER — OXYTOCIN BOLUS FROM INFUSION
500.0000 mL | Freq: Once | INTRAVENOUS | Status: AC
  Administered 2012-03-04: 500 mL via INTRAVENOUS
  Filled 2012-03-04: qty 500
  Filled 2012-03-04: qty 1000

## 2012-03-04 MED ORDER — DEXTROSE 5 % IV SOLN
2.5000 10*6.[IU] | INTRAVENOUS | Status: DC
  Filled 2012-03-04 (×3): qty 2.5

## 2012-03-04 MED ORDER — LACTATED RINGERS IV SOLN: 500.0000 mL | INTRAVENOUS | Status: DC | PRN

## 2012-03-04 MED ORDER — CITRIC ACID-SODIUM CITRATE 334-500 MG/5ML PO SOLN: 30.0000 mL | ORAL | Status: DC | PRN

## 2012-03-04 MED ORDER — ONDANSETRON HCL 4 MG/2ML IJ SOLN: 4.0000 mg | INTRAMUSCULAR | Status: DC | PRN

## 2012-03-04 MED ORDER — LIDOCAINE HCL (PF) 1 % IJ SOLN
30.0000 mL | INTRAMUSCULAR | Status: DC | PRN
  Filled 2012-03-04: qty 30

## 2012-03-04 MED ORDER — TETANUS-DIPHTH-ACELL PERTUSSIS 5-2.5-18.5 LF-MCG/0.5 IM SUSP
0.5000 mL | Freq: Once | INTRAMUSCULAR | Status: AC
  Administered 2012-03-05: 0.5 mL via INTRAMUSCULAR
  Filled 2012-03-04: qty 0.5

## 2012-03-04 MED ORDER — IBUPROFEN 600 MG PO TABS
600.0000 mg | ORAL_TABLET | Freq: Four times a day (QID) | ORAL | Status: DC
  Administered 2012-03-04 – 2012-03-06 (×9): 600 mg via ORAL
  Filled 2012-03-04 (×9): qty 1

## 2012-03-04 MED ORDER — OXYCODONE-ACETAMINOPHEN 5-325 MG PO TABS: 1.0000 | ORAL_TABLET | ORAL | Status: DC | PRN

## 2012-03-04 MED ORDER — SIMETHICONE 80 MG PO CHEW: 80.0000 mg | CHEWABLE_TABLET | ORAL | Status: DC | PRN

## 2012-03-04 MED ORDER — PENICILLIN G POTASSIUM 5000000 UNITS IJ SOLR
5.0000 10*6.[IU] | Freq: Once | INTRAVENOUS | Status: AC
  Administered 2012-03-04: 5 10*6.[IU] via INTRAVENOUS
  Filled 2012-03-04: qty 5

## 2012-03-04 MED ORDER — BENZOCAINE-MENTHOL 20-0.5 % EX AERO
1.0000 "application " | INHALATION_SPRAY | CUTANEOUS | Status: DC | PRN
  Filled 2012-03-04: qty 56

## 2012-03-04 MED ORDER — SENNOSIDES-DOCUSATE SODIUM 8.6-50 MG PO TABS
2.0000 | ORAL_TABLET | Freq: Every day | ORAL | Status: DC
  Administered 2012-03-04 – 2012-03-05 (×2): 2 via ORAL

## 2012-03-04 MED ORDER — PRENATAL MULTIVITAMIN CH
1.0000 | ORAL_TABLET | Freq: Every day | ORAL | Status: DC
  Administered 2012-03-04 – 2012-03-06 (×3): 1 via ORAL
  Filled 2012-03-04 (×3): qty 1

## 2012-03-04 MED ORDER — ONDANSETRON HCL 4 MG/2ML IJ SOLN: 4.0000 mg | Freq: Four times a day (QID) | INTRAMUSCULAR | Status: DC | PRN

## 2012-03-04 MED ORDER — DIPHENHYDRAMINE HCL 25 MG PO CAPS: 25.0000 mg | ORAL_CAPSULE | Freq: Four times a day (QID) | ORAL | Status: DC | PRN

## 2012-03-04 MED ORDER — WITCH HAZEL-GLYCERIN EX PADS: 1.0000 "application " | MEDICATED_PAD | CUTANEOUS | Status: DC | PRN

## 2012-03-04 MED ORDER — FLEET ENEMA 7-19 GM/118ML RE ENEM: 1.0000 | ENEMA | RECTAL | Status: DC | PRN

## 2012-03-04 MED ORDER — DIBUCAINE 1 % RE OINT
1.0000 "application " | TOPICAL_OINTMENT | RECTAL | Status: DC | PRN
  Filled 2012-03-04: qty 28

## 2012-03-04 MED ORDER — ACETAMINOPHEN 325 MG PO TABS: 650.0000 mg | ORAL_TABLET | ORAL | Status: DC | PRN

## 2012-03-04 MED ORDER — FENTANYL CITRATE 0.05 MG/ML IJ SOLN: 100.0000 ug | INTRAMUSCULAR | Status: DC | PRN

## 2012-03-04 MED ORDER — LACTATED RINGERS IV SOLN: INTRAVENOUS | Status: DC

## 2012-03-04 MED ORDER — ONDANSETRON HCL 4 MG PO TABS: 4.0000 mg | ORAL_TABLET | ORAL | Status: DC | PRN

## 2012-03-04 NOTE — MAU Note (Signed)
[facility] called and notified of the admission of this pt-states OK for admission

## 2012-03-04 NOTE — Progress Notes (Signed)
[name] is a 36 y.o. [id number] at [redacted]w[redacted]d by admitted for active labor  Subjective: Requests cervical exam secondary to increased nausea and bloody show  Objective: BP 112/71  Pulse 82  Temp(Src) 98 F (36.7 C) (Axillary)  Resp 18  Ht 5\' 9"  (1.753 m)  Wt 169 lb (76.658 kg)  BMI 24.96 kg/m2  LMP 06/28/2011  Breastfeeding? Unknown      FHT:  FHR: 130 bpm, variability: moderate,  accelerations:  Present,  decelerations:  Absent UC:   regular, every 2-4 minutes SVE:  8/100/vtx/-1/BBOW  Labs: Lab Results  Component Value Date   WBC 9.1 03/04/2012   HGB 10.4* 03/04/2012   HCT 33.1* 03/04/2012   MCV 79.0 03/04/2012   PLT 138* 03/04/2012    Assessment / Plan: Spontaneous labor, progressing normally  Labor: Progressing normally Preeclampsia:  n/a Fetal Wellbeing:  Category I Pain Control:  Labor support without medications I/D:  PCN for GBS Anticipated MOD:  NSVD Will plan AROM after ABX in if no SROM prior  Berdia Lachman E. 03/04/2012, 6:21 AM

## 2012-03-04 NOTE — H&P (Signed)
Attestation of Attending Supervision of Advanced Practitioner: Evaluation and management procedures were performed by the [facility] Fellow/PA/CNM/NP under my supervision and collaboration. Chart reviewed, and agree with management and plan.  Jaynie Collins, M.D. 03/04/2012 2:17 PM

## 2012-03-04 NOTE — H&P (Signed)
Chief Complaint: Labor  [name] is  36 y.o. [id number].  Patient's last menstrual period was 06/28/2011..[redacted]w[redacted]d by LMP She presents complaining of Labor. Reports regular painful contractions and bloody show and good FM. Denies LOF. PNC at [facility] for [employer].   Obstetrical/Gynecological History:  1 Term 08/25/04 [redacted]w[redacted]d 8 lb (3.629 kg) M SVD EPI No Yes [facility]  2A Term 06/12/06 [redacted]w[redacted]d 6 lb 11 oz (3.033 kg) M SVD EPI No Yes [facility] [place] [facility]   2B 06/12/06 [redacted]w[redacted]d 6 lb 12 oz (3.062 kg) F SVD EPI No Yes [facility] 3 Term 12/31/08 [redacted]w[redacted]d 7 lb 5 oz (3.317 kg) F SVD None No Yes [facility] 4 Term 05/12/10 [redacted]w[redacted]d 7 lb 12 oz (3.515 kg) F SVD None No Yes [facility]  5 Current   Past Medical History: Past Medical History  Diagnosis Date  . Allergy     seasonal  . ANXIETY DEPRESSION 11/21/2010  . ALLERGIC RHINITIS 11/21/2010  . Other specified anemias 11/21/2010  . Anxiety   . Asthma     Past Surgical History: No past surgical history on file.  Family History: Family History  Problem Relation Age of Onset  . Hyperlipidemia Mother   . Osteoarthritis Mother   . Diabetes Father     Type 2  . Ulcers Father   . Migraines Sister   . Bipolar disorder Brother   . Panic disorder Brother   . Arthritis Maternal Grandmother   . Cancer Maternal Grandmother     uterine  . Cancer Maternal Grandfather   . Heart disease Maternal Grandfather   . Heart attack Maternal Grandfather   . Diabetes Paternal Grandmother   . Hypertension Paternal Grandmother   . Cancer Paternal Grandfather     multiple myeloma    Social History: History  Substance Use Topics  . Smoking status: Never Smoker   . Smokeless tobacco: Never Used  . Alcohol Use: 0.0 oz/week    0 drink(s) per week     rare occasional glass of wine    Allergies: No Known Allergies  Prescriptions prior to admission    Medication Sig Dispense Refill  . acetaminophen (TYLENOL) 650 MG CR tablet Take 1,300 mg by mouth every 8 (eight) hours as needed. For pain      . albuterol (PROAIR HFA) 108 (90 BASE) MCG/ACT inhaler Inhale 2 puffs into the lungs every 6 (six) hours as needed. For wheezing       . ciclesonide (OMNARIS) 50 MCG/ACT nasal spray Place 2 sprays into both nostrils daily.       Marland Kitchen dexlansoprazole (DEXILANT) 60 MG capsule Take 60 mg by mouth daily.        Marland Kitchen venlafaxine (EFFEXOR-XR) 150 MG 24 hr capsule TAKE ONE CAPSULE BY MOUTH EVERY DAY  30 capsule  1    Review of Systems - Negative except for contractions  Physical Exam   Blood pressure 119/71, pulse 94, temperature 98.6 F (37 C), resp. rate 20, height 5\' 9"  (1.753 m), weight 169 lb (76.658 kg), last menstrual period 06/28/2011.  General: General appearance - alert, well appearing, and in no distress and oriented to person, place, and time Mental status - alert, oriented to person, place, and time, normal mood, behavior, speech, dress, motor activity, and thought processes, affect appropriate to mood Heart - normal rate, regular rhythm, normal S1, S2, no murmurs, rubs, clicks or gallops Abdomen -  gravid, contracting, non tender, good resting tone Extremities - peripheral pulses normal, no pedal edema, no clubbing or cyanosis Focused Gynecological Exam: 4-5/100/vtx/-2  Labs: GBS pending, AB pos, 1 hour glucola: 90, HIV NR, Hep B neg, Rubella Imm  Assessment: Active labor at [redacted]w[redacted]d Fetal Testing c/w Well Being  Plan: Admit to [facility] Expectant management Will treat with PCN due to unknown GBS < 37 weeks  Josiel Gahm E. 03/04/2012,3:57 AM

## 2012-03-04 NOTE — Consult Note (Signed)
Neonatology Note:   Attendance at Delivery:    I was asked to attend this NSVD at 35 5/7 weeks after onset of labor. The mother is a G5P4L5 AB pos, GBS pos who received 1 dose of antibiotic just prior to delivery. ROM at delivery delivery, fluid clear. Infant breathing, with good HR and tone. Needed bulb suctioning several times as he was sounding congested, but it was difficult to get the mucous out. He breathed well, but did not cry much except with vigorous stimulation, which we provided. CPT done briefly. Ap 7/9. Lungs had a few rales in DR, but the baby was always pink in room air and without distress. I felt he could stay with parents in LDR, but gave instructions to [facility] nurse to observe him closely and move to CN for any problems. To CN to care of Pediatrician.   Deatra James, MD

## 2012-03-05 NOTE — Progress Notes (Signed)
PPD #1  S. She would like to go home today but the baby will be staying here until tomorrow, so she elects to stay until then. She reports a significant decrease of her lochia today and denies any problems. Baby is breastfeeding well.  O. VSS, AF      Abd- benign      Uterus- firm and NT at U-1  A/P. PPD #1 doing well- plan for discharge home tomorrow.

## 2012-03-06 MED ORDER — WITCH HAZEL-GLYCERIN EX PADS: 1.0000 "application " | MEDICATED_PAD | CUTANEOUS | Status: DC | PRN

## 2012-03-06 MED ORDER — DIBUCAINE 1 % RE OINT: 1.0000 "application " | TOPICAL_OINTMENT | RECTAL | Status: DC | PRN

## 2012-03-06 MED ORDER — IBUPROFEN 600 MG PO TABS: 600.0000 mg | ORAL_TABLET | Freq: Four times a day (QID) | ORAL | Status: AC

## 2012-03-06 MED ORDER — OXYCODONE-ACETAMINOPHEN 5-325 MG PO TABS: 1.0000 | ORAL_TABLET | ORAL | Status: AC | PRN

## 2012-03-06 NOTE — Discharge Summary (Signed)
Physician Discharge Summary  Patient ID: IMAGENE BOSS MRN: [id number] DOB/AGE: 1976/05/13 36 y.o.  Admit date: 03/04/2012 Discharge date: 03/06/2012  Admission Diagnoses: 35.[redacted] weeks EGA, labot  Discharge Diagnoses: same  Hospital Course: She was admitted and had a NSVD with an intact perineum. She did well post partum and was ready to go home on PPD #2.  Discharge Exam: Blood pressure 100/65, pulse 83, temperature 98.6 F (37 C), temperature source Oral, resp. rate 18, height 5\' 9"  (1.753 m), weight 76.658 kg (169 lb), last menstrual period 06/28/2011, unknown if currently breastfeeding. General appearance: alert Resp: clear to auscultation bilaterally Cardio: regular rate and rhythm, S1, S2 normal, no murmur, click, rub or gallop GI: soft, non-tender; bowel sounds normal; no masses,  no organomegaly  Disposition: 01-Home or Self Care  Discharge Orders    Future Appointments: Provider: Department: Dept Phone: Center:   03/15/2012 2:15 PM Allie Bossier, MD [facility] [phone] [id number]     Medication List  As of 03/06/2012  7:43 AM   ASK your doctor about these medications         acetaminophen 650 MG CR tablet   Commonly known as: TYLENOL   Take 1,300 mg by mouth every 8 (eight) hours as needed. For pain      ciclesonide 50 MCG/ACT nasal spray   Commonly known as: OMNARIS   Place 2 sprays into both nostrils daily.      DEXILANT 60 MG capsule   Generic drug: dexlansoprazole   Take 60 mg by mouth daily.      PROAIR HFA 108 (90 BASE) MCG/ACT inhaler   Generic drug: albuterol   Inhale 2 puffs into the lungs every 6 (six) hours as needed. For wheezing      venlafaxine XR 150 MG 24 hr capsule   Commonly known as: EFFEXOR-XR   TAKE ONE CAPSULE BY MOUTH EVERY DAY             Signed: Detavious Rinn C. 03/06/2012, 7:43 AM

## 2012-03-06 NOTE — Progress Notes (Signed)
PPD #2  S. She is ready to go home today with no problems. She would like her son circumsized. Her husband plans to have a vasectomy.  O. VSS, AF      Uterus- firm and NT at U   A/P. PPD #2 doing well. Discharge home.

## 2012-03-09 ENCOUNTER — Encounter: Payer: Self-pay | Admitting: Obstetrics & Gynecology

## 2012-03-09 DIAGNOSIS — O99820 Streptococcus B carrier state complicating pregnancy: Secondary | ICD-10-CM | POA: Insufficient documentation

## 2012-03-15 ENCOUNTER — Encounter: Payer: 59 | Admitting: Obstetrics & Gynecology

## 2012-03-31 ENCOUNTER — Other Ambulatory Visit: Payer: Self-pay

## 2012-03-31 MED ORDER — CEPHALEXIN 500 MG PO CAPS: 500.0000 mg | ORAL_CAPSULE | Freq: Three times a day (TID) | ORAL | Status: DC

## 2012-03-31 MED ORDER — VENLAFAXINE HCL ER 150 MG PO CP24: 150.0000 mg | ORAL_CAPSULE | Freq: Every day | ORAL | Status: DC

## 2012-03-31 MED ORDER — FLUCONAZOLE 150 MG PO TABS: 150.0000 mg | ORAL_TABLET | Freq: Once | ORAL | Status: DC

## 2012-03-31 NOTE — Telephone Encounter (Signed)
Per Dr Abner Greenspan send in Keflex 500 mg po tid X 10 days #30 X 0 and Diflucan 150 mg 1 tab po daily X 3 days may repeat in 1 week # 6 X 1 refill

## 2012-05-04 ENCOUNTER — Other Ambulatory Visit: Payer: Self-pay | Admitting: Family Medicine

## 2012-05-04 DIAGNOSIS — N61 Mastitis without abscess: Secondary | ICD-10-CM

## 2012-05-04 MED ORDER — CEPHALEXIN 500 MG PO CAPS: 500.0000 mg | ORAL_CAPSULE | Freq: Three times a day (TID) | ORAL | Status: DC

## 2012-05-04 NOTE — Progress Notes (Signed)
Patient in today for infant's 2 month WCC, c/o recurrent mastitis on right, with increased discomfort. Was previously treated with Keflex with good results, is given a refill on that and encouraged to cleanse the area with [facility] Astringent and take a probiotics daily for next month

## 2012-06-14 ENCOUNTER — Encounter: Payer: Self-pay | Admitting: Family Medicine

## 2012-06-14 ENCOUNTER — Telehealth: Payer: Self-pay

## 2012-06-14 ENCOUNTER — Ambulatory Visit (INDEPENDENT_AMBULATORY_CARE_PROVIDER_SITE_OTHER): Payer: 59 | Admitting: Family Medicine

## 2012-06-14 VITALS — BP 112/73 | HR 113 | Temp 97.4°F | Ht 69.0 in | Wt 154.4 lb

## 2012-06-14 DIAGNOSIS — E860 Dehydration: Secondary | ICD-10-CM | POA: Insufficient documentation

## 2012-06-14 DIAGNOSIS — T7840XA Allergy, unspecified, initial encounter: Secondary | ICD-10-CM

## 2012-06-14 DIAGNOSIS — R5383 Other fatigue: Secondary | ICD-10-CM

## 2012-06-14 DIAGNOSIS — J309 Allergic rhinitis, unspecified: Secondary | ICD-10-CM

## 2012-06-14 DIAGNOSIS — N61 Mastitis without abscess: Secondary | ICD-10-CM | POA: Insufficient documentation

## 2012-06-14 HISTORY — DX: Dehydration: E86.0

## 2012-06-14 HISTORY — DX: Mastitis without abscess: N61.0

## 2012-06-14 LAB — CBC
Hemoglobin: 12.7 g/dL (ref 12.0–15.0)
RDW: 18.2 % — ABNORMAL HIGH (ref 11.5–14.6)
WBC: 14.8 10*3/uL — ABNORMAL HIGH (ref 4.5–10.5)

## 2012-06-14 LAB — RENAL FUNCTION PANEL
Albumin: 4 g/dL (ref 3.5–5.2)
Chloride: 101 mEq/L (ref 96–112)
GFR: 63.92 mL/min (ref >60.00)
Phosphorus: 2.8 mg/dL (ref 2.3–4.6)
Potassium: 3.9 mEq/L (ref 3.5–5.1)

## 2012-06-14 MED ORDER — OXYCODONE-ACETAMINOPHEN 5-325 MG PO TABS: 1.0000 | ORAL_TABLET | Freq: Three times a day (TID) | ORAL | Status: AC | PRN

## 2012-06-14 MED ORDER — CEPHALEXIN 500 MG PO CAPS: 500.0000 mg | ORAL_CAPSULE | Freq: Four times a day (QID) | ORAL | Status: AC

## 2012-06-14 MED ORDER — OXYCODONE-ACETAMINOPHEN 5-325 MG PO TABS: 1.0000 | ORAL_TABLET | Freq: Three times a day (TID) | ORAL | Status: DC | PRN

## 2012-06-14 MED ORDER — FLUTICASONE PROPIONATE 50 MCG/ACT NA SUSP: 2.0000 | Freq: Every day | NASAL | Status: DC

## 2012-06-14 MED ORDER — CEFTRIAXONE SODIUM 1 G IJ SOLR
1.0000 g | Freq: Once | INTRAMUSCULAR | Status: AC
  Administered 2012-06-14: 1 g via INTRAMUSCULAR

## 2012-06-14 NOTE — Patient Instructions (Addendum)
Mastitis   Mastitis is a bacterial infection of the breast tissue.  CAUSES   Bacteria causes infection by entering the breast tissue through cuts or openings in the skin. Typically, this occurs with breastfeeding due to cracked or irritated skin. It can be associated with plugged ducts. Nipple piercing can also lead to mastitis.  SYMPTOMS   In mastitis, an area of the breast becomes swollen, red, tender, and painful. You may notice you have a fever and swelling of the glands under your arm on that side. If the infection is allowed to progress, a collection of pus (abscess) may develop.  DIAGNOSIS   Your caregiver can diagnose mastitis based on your symptoms and upon examination. The diagnosis can be confirmed if pus can be expressed from the breast. This pus can be examined in the lab to determine which bacteria are present. If an abscess has developed, the fluid in the abscess can be removed with a needle. This is used to confirm the diagnosis and determine the bacteria present. In most cases, pus will not be present. Blood tests can be done to determine if your body is fighting a bacterial infection. Sometimes, a mammogram or ultrasound will be recommended to exclude other breast diseases including cancer.  Other rare forms of mastitis:   Tuberculosis mastitis is rare. The TB germ can affect the breast if it is present in some other part of the body. The breast may be slightly tender with a mass, but not tender or painful.   Syphilis of the nipple usually has an ulcer that is not tender.   Actinomycosis is a very rare bacterial infection of the breast that presents as a mass in the breast that is not tender or painful.   Phlebitis (inflammation of blood vessels) of the breast is an inflammation of the veins in the breast. It may be caused by tight fitting bras, surgery, or trauma to the breast.   Inflammatory carcinoma of the breast looks like mastitis because the breasts are red, swollen, or tender, but it  is a rare form of breast cancer.  TREATMENT   Antibiotic medication is used to treat the bacterial infection. Your caregiver will determine which bacteria are most likely to be causing the infection and select an antibiotic. This is sometimes changed based on the results of cultures, or if there is no response to the antibiotic selected. Antibiotics are usually given by mouth. If you are breastfeeding, it is important to continue to empty the breast. Your caregiver can tell you whether or not this milk is safe for your infant, or needs to be thrown away. Pain can usually be treated with medication.  HOME CARE INSTRUCTIONS    Take your antibiotics as directed. Finish them even if you start to feel better.   Only take over-the-counter or prescription medication for pain, discomfort, or fever as directed by your caregiver.   If breastfeeding, keep your nipples clean and dry. Your caregiver may tell you to stop nursing until he or she feels it is safe for your baby. Use a breast pump as instructed if forced to stop nursing.   Do not wear a tight bra. Wear a good support bra.   Empty the first breast completely before going to the other breast. If your baby is not emptying your breasts completely for some reason, use a breast pump to empty your breasts.   If you go back to work, pump your breasts while at work to stay in time   you have a fever.   Avoid having your breasts get overly filled with milk (engorged).  SEEK MEDICAL CARE IF:   You develop pus-like (purulent) discharge from the breast.   Your symptoms get worse.   You do not seem to be responding to your treatment within 2 days.  SEEK IMMEDIATE MEDICAL CARE IF:   You have a fever.   Your pain and swelling is getting worse.   You develop pain that is not controlled with medicine.   You develop a red line extending from the breast toward your armpit.  Document  Released: 11/02/2005 Document Revised: 10/22/2011 Document Reviewed: 06/22/2008 [employer] Patient Information 2012  Dehydration, Adult Dehydration is when you lose more fluids from the body than you take in. Vital organs like the kidneys, brain, and heart cannot function without a proper amount of fluids and salt. Any loss of fluids from the body can cause dehydration.  CAUSES   Vomiting.   Diarrhea.   Excessive sweating.   Excessive urine output.   Fever.  SYMPTOMS  Mild dehydration  Thirst.   Dry lips.   Slightly dry mouth.  Moderate dehydration  Very dry mouth.   Sunken eyes.   Skin does not bounce back quickly when lightly pinched and released.   Dark urine and decreased urine production.   Decreased tear production.   Headache.  Severe dehydration  Very dry mouth.   Extreme thirst.   Rapid, weak pulse (more than 100 beats per minute at rest).   Cold hands and feet.   Not able to sweat in spite of heat and temperature.   Rapid breathing.   Blue lips.   Confusion and lethargy.   Difficulty being awakened.   Minimal urine production.   No tears.  DIAGNOSIS  Your caregiver will diagnose dehydration based on your symptoms and your exam. Blood and urine tests will help confirm the diagnosis. The diagnostic evaluation should also identify the cause of dehydration. TREATMENT  Treatment of mild or moderate dehydration can often be done at home by increasing the amount of fluids that you drink. It is best to drink small amounts of fluid more often. Drinking too much at one time can make vomiting worse. Refer to the home care instructions below. Severe dehydration needs to be treated at the hospital where you will probably be given intravenous (IV) fluids that contain water and electrolytes. HOME CARE INSTRUCTIONS   Ask your caregiver about specific rehydration instructions.   Drink enough fluids to keep your urine clear or pale yellow.   Drink small  amounts frequently if you have nausea and vomiting.   Eat as you normally do.   Avoid:   Foods or drinks high in sugar.   Carbonated drinks.   Juice.   Extremely hot or cold fluids.   Drinks with caffeine.   Fatty, greasy foods.   Alcohol.   Tobacco.   Overeating.   Gelatin desserts.   Wash your hands well to avoid spreading bacteria and viruses.   Only take over-the-counter or prescription medicines for pain, discomfort, or fever as directed by your caregiver.   Ask your caregiver if you should continue all prescribed and over-the-counter medicines.   Keep all follow-up appointments with your caregiver.  SEEK MEDICAL CARE IF:  You have abdominal pain and it increases or stays in one area (localizes).   You have a rash, stiff neck, or severe headache.   You are irritable, sleepy, or difficult to awaken.   You  are weak, dizzy, or extremely thirsty.  SEEK IMMEDIATE MEDICAL CARE IF:   You are unable to keep fluids down or you get worse despite treatment.   You have frequent episodes of vomiting or diarrhea.   You have blood or green matter (bile) in your vomit.   You have blood in your stool or your stool looks black and tarry.   You have not urinated in 6 to 8 hours, or you have only urinated a small amount of very dark urine.   You have a fever.   You faint.  MAKE SURE YOU:   Understand these instructions.   Will watch your condition.   Will get help right away if you are not doing well or get worse.  Document Released: 11/02/2005 Document Revised: 10/22/2011 Document Reviewed: 06/22/2011 [facility] Patient Information 2012 [place], [place].

## 2012-06-14 NOTE — Assessment & Plan Note (Signed)
Refill given on Flonase today

## 2012-06-14 NOTE — Assessment & Plan Note (Signed)
Checked renal panel, tsh, and cbc today due to this, infection and dehydration

## 2012-06-14 NOTE — Progress Notes (Signed)
[name], female   DOB: 10/25/1976, 36 y.o.   MRN: [id number] [name] [id number] Apr 02, 1976 06/14/2012      Progress Note-Follow Up  Subjective  Chief Complaint  Chief Complaint  Patient presents with  . mastitis    X 3-4 days, severe last night w/headache, fever, breast pain    HPI  Patient is a 36 year old Caucasian female who was struggling with recurrent mastitis as she nurses her sixth child. She has been struggling with symptoms this time for roughly a week. She struggled with some breast pain and tenderness. Some malaise and over the last 2 days unfortunately has gotten progressively worse. She's had increasing fevers, chills, malaise, myalgias, anorexia, nausea as well as vomiting overnight and significant pain. She has been taking Percocet infrequently to help the pain is only marginally helpful. She doesn't know she's been eating and drinking poorly over the last several days. No constipation or diarrhea. She does note a significant headache that has only been minimally helped by Percocet as well  Past Medical History  Diagnosis Date  . Allergy     seasonal  . ANXIETY DEPRESSION 11/21/2010  . ALLERGIC RHINITIS 11/21/2010  . Other specified anemias 11/21/2010  . Anxiety   . Asthma   . Mastitis 06/14/2012  . Dehydration 06/14/2012     Family History  Problem Relation Age of Onset  . Hyperlipidemia Mother   . Osteoarthritis Mother   . Diabetes Father     Type 2  . Ulcers Father   . Migraines Sister   . Bipolar disorder Brother   . Panic disorder Brother   . Arthritis Maternal Grandmother   . Cancer Maternal Grandmother     uterine  . Cancer Maternal Grandfather   . Heart disease Maternal Grandfather   . Heart attack Maternal Grandfather   . Diabetes Paternal Grandmother   . Hypertension Paternal Grandmother   . Cancer Paternal Grandfather     multiple myeloma  . Anesthesia problems Neg Hx   . Hypotension Neg Hx   . Malignant hyperthermia  Neg Hx   . Pseudochol deficiency Neg Hx     History   Social History  . Marital Status: Married    Spouse Name: N/A    Number of Children: N/A  . Years of Education: N/A   Occupational History  . Not on file.   Social History Main Topics  . Smoking status: Never Smoker   . Smokeless tobacco: Never Used  . Alcohol Use: 0.0 oz/week    0 drink(s) per week     rare occasional glass of wine  . Drug Use: No  . Sexually Active: Yes   Other Topics Concern  . Not on file   Social History Narrative  . No narrative on file    Current Outpatient Prescriptions on File Prior to Visit  Medication Sig Dispense Refill  . acetaminophen (TYLENOL) 650 MG CR tablet Take 1,300 mg by mouth every 8 (eight) hours as needed. For pain      . albuterol (PROAIR HFA) 108 (90 BASE) MCG/ACT inhaler Inhale 2 puffs into the lungs every 6 (six) hours as needed. For wheezing       . venlafaxine XR (EFFEXOR-XR) 150 MG 24 hr capsule Take 1 capsule (150 mg total) by mouth daily.  30 capsule  4  . cephALEXin (KEFLEX) 500 MG capsule Take 1 capsule (500 mg total) by mouth 3 (three) times daily. X 10 days  30 capsule  0  . dibucaine (NUPERCAINAL) 1 % OINT Place 1 application rectally as needed.  1 Tube  4  . fluticasone (FLONASE) 50 MCG/ACT nasal spray Place 2 sprays into the nose daily.  1 g  2   No current facility-administered medications on file prior to visit.    No Known Allergies  Review of Systems  Review of Systems  Constitutional: Positive for fever, chills and malaise/fatigue.  HENT: Negative for congestion.   Eyes: Negative for discharge.  Respiratory: Negative for shortness of breath.   Cardiovascular: Negative for chest pain, palpitations and leg swelling.  Gastrointestinal: Positive for nausea and vomiting. Negative for abdominal pain and diarrhea.  Genitourinary: Negative for dysuria.  Musculoskeletal: Positive for myalgias. Negative for falls.  Skin: Negative for rash.  Neurological:  Positive for headaches. Negative for loss of consciousness.  Endo/Heme/Allergies: Negative for polydipsia.  Psychiatric/Behavioral: Negative for depression and suicidal ideas. The patient is not nervous/anxious and does not have insomnia.     Objective  BP 112/73  Pulse 113  Temp 97.4 F (36.3 C) (Temporal)  Ht 5\' 9"  (1.753 m)  Wt 154 lb 6.4 oz (70.035 kg)  BMI 22.80 kg/m2  SpO2 97%  Physical Exam  Physical Exam  Constitutional: She is oriented to person, place, and time and well-developed, well-nourished, and in no distress. No distress.       pallor  HENT:  Head: Normocephalic and atraumatic.  Eyes: Conjunctivae are normal.  Neck: Neck supple. No thyromegaly present.  Cardiovascular: Normal rate, regular rhythm and normal heart sounds.   No murmur heard. Pulmonary/Chest: Effort normal and breath sounds normal. She has no wheezes.  Abdominal: She exhibits no distension and no mass.  Genitourinary:       Right breast firm tender around nipple in b/l upper quadrants, erythema and heat noted. Left breast unremarkable  Musculoskeletal: She exhibits no edema.  Lymphadenopathy:    She has no cervical adenopathy.  Neurological: She is alert and oriented to person, place, and time.  Skin: Skin is warm and dry. No rash noted. She is not diaphoretic.  Psychiatric: Memory, affect and judgment normal.    No results found for this basename: TSH   Lab Results  Component Value Date   WBC 9.1 03/04/2012   HGB 10.4* 03/04/2012   HCT 33.1* 03/04/2012   MCV 79.0 03/04/2012   PLT 138* 03/04/2012   Lab Results  Component Value Date   CREATININE 0.60 01/17/2012   BUN 8 01/17/2012   NA 137 01/17/2012   K 3.9 01/17/2012   CL 104 01/17/2012   CO2 24 01/17/2012   Lab Results  Component Value Date   ALT 5 01/17/2012   AST 13 01/17/2012   ALKPHOS 61 01/17/2012   BILITOT 0.1* 01/17/2012     Assessment & Plan  Mastitis Recurrent, given a shot of Ceftriaxone 1 gm today and started on a 10 day course  of Keflex, asked to increase hydration and add a probiotic, report if no improvement  Dehydration Checked renal panel, tsh, and cbc today due to this, infection and dehydration  ALLERGIC RHINITIS Refill given on Flonase today

## 2012-06-14 NOTE — Assessment & Plan Note (Signed)
Recurrent, given a shot of Ceftriaxone 1 gm today and started on a 10 day course of Keflex, asked to increase hydration and add a probiotic, report if no improvement

## 2012-06-14 NOTE — Telephone Encounter (Signed)
Per MD have pt schedule an appt.  Appt scheduled

## 2012-06-14 NOTE — Telephone Encounter (Signed)
Per MD 

## 2012-06-14 NOTE — Telephone Encounter (Signed)
Patients spouse states pt is having severe breast pain, fever, headache, back pain that started yesterday. Can something be called into the pharmacy or does patient need to come in for an appt? Please advise?

## 2012-07-05 ENCOUNTER — Other Ambulatory Visit: Payer: Self-pay

## 2012-07-05 DIAGNOSIS — N61 Mastitis without abscess: Secondary | ICD-10-CM

## 2012-07-05 MED ORDER — CEPHALEXIN 500 MG PO CAPS: 500.0000 mg | ORAL_CAPSULE | Freq: Four times a day (QID) | ORAL | Status: DC

## 2012-07-05 NOTE — Telephone Encounter (Signed)
Per patients spouse pt is starting to get symptoms of the Mastitis again. Patients symptoms aren't as bad yet but would like to stop it before it gets bad.   Per MD ok to send in Keflex 500 QID X 10 days with 1 refill.   RX sent and pts spouse informed

## 2012-08-26 ENCOUNTER — Other Ambulatory Visit: Payer: Self-pay

## 2012-08-26 MED ORDER — VENLAFAXINE HCL ER 150 MG PO CP24: 150.0000 mg | ORAL_CAPSULE | Freq: Every day | ORAL | Status: DC

## 2012-09-14 ENCOUNTER — Ambulatory Visit (INDEPENDENT_AMBULATORY_CARE_PROVIDER_SITE_OTHER): Payer: 59

## 2012-09-14 DIAGNOSIS — Z23 Encounter for immunization: Secondary | ICD-10-CM

## 2012-11-21 ENCOUNTER — Ambulatory Visit: Payer: 59 | Admitting: Family Medicine

## 2012-11-21 ENCOUNTER — Telehealth: Payer: Self-pay

## 2012-11-21 MED ORDER — PREDNISONE 20 MG PO TABS: 20.0000 mg | ORAL_TABLET | ORAL | Status: DC

## 2012-11-21 NOTE — Telephone Encounter (Signed)
pts spouse called stating pt is still about the same as he had discussed with MD but thinks pt would benefit from oral steroids. Pts spouse would like to know if these can be called into the pharmacy. Callback number [phone]

## 2012-11-21 NOTE — Telephone Encounter (Signed)
Verbal from MD send in Prednisone 20 mg take 2 pills daily X 5 days, 1 pill daily X 5 days, 1/2 pill daily X 5 days.  RX sent and pts spouse informed

## 2012-12-14 ENCOUNTER — Other Ambulatory Visit: Payer: Self-pay | Admitting: Family Medicine

## 2013-01-22 ENCOUNTER — Other Ambulatory Visit: Payer: Self-pay | Admitting: Family Medicine

## 2013-01-26 ENCOUNTER — Other Ambulatory Visit: Payer: Self-pay | Admitting: Family Medicine

## 2013-01-26 NOTE — Telephone Encounter (Signed)
Effexor request [last Rx 03.09.14 #30x4]; Denied, verified w/pharmacy prior Rx was received/SLS

## 2013-03-10 ENCOUNTER — Encounter: Payer: Self-pay | Admitting: Family Medicine

## 2013-03-10 ENCOUNTER — Ambulatory Visit (INDEPENDENT_AMBULATORY_CARE_PROVIDER_SITE_OTHER): Payer: 59 | Admitting: Family Medicine

## 2013-03-10 VITALS — BP 118/79 | HR 108 | Temp 97.5°F | Ht 69.0 in | Wt 146.5 lb

## 2013-03-10 DIAGNOSIS — F419 Anxiety disorder, unspecified: Secondary | ICD-10-CM

## 2013-03-10 DIAGNOSIS — F341 Dysthymic disorder: Secondary | ICD-10-CM

## 2013-03-10 DIAGNOSIS — J309 Allergic rhinitis, unspecified: Secondary | ICD-10-CM

## 2013-03-10 DIAGNOSIS — F329 Major depressive disorder, single episode, unspecified: Secondary | ICD-10-CM

## 2013-03-10 DIAGNOSIS — F410 Panic disorder [episodic paroxysmal anxiety] without agoraphobia: Secondary | ICD-10-CM

## 2013-03-10 MED ORDER — VENLAFAXINE HCL ER 150 MG PO CP24: 150.0000 mg | ORAL_CAPSULE | Freq: Every day | ORAL | Status: DC

## 2013-03-10 MED ORDER — ALPRAZOLAM 0.25 MG PO TABS: ORAL_TABLET | ORAL | Status: DC

## 2013-03-10 NOTE — Patient Instructions (Addendum)
Preventive Care for Adults, Female A healthy lifestyle and preventive care can promote health and wellness. Preventive health guidelines for women include the following key practices.  A routine yearly physical is a good way to check with your caregiver about your health and preventive screening. It is a chance to share any concerns and updates on your health, and to receive a thorough exam.  Visit your dentist for a routine exam and preventive care every 6 months. Brush your teeth twice a day and floss once a day. Good oral hygiene prevents tooth decay and gum disease.  The frequency of eye exams is based on your age, health, family medical history, use of contact lenses, and other factors. Follow your caregiver's recommendations for frequency of eye exams.  Eat a healthy diet. Foods like vegetables, fruits, whole grains, low-fat dairy products, and lean protein foods contain the nutrients you need without too many calories. Decrease your intake of foods high in solid fats, added sugars, and salt. Eat the right amount of calories for you.Get information about a proper diet from your caregiver, if necessary.  Regular physical exercise is one of the most important things you can do for your health. Most adults should get at least 150 minutes of moderate-intensity exercise (any activity that increases your heart rate and causes you to sweat) each week. In addition, most adults need muscle-strengthening exercises on 2 or more days a week.  Maintain a healthy weight. The body mass index (BMI) is a screening tool to identify possible weight problems. It provides an estimate of body fat based on height and weight. Your caregiver can help determine your BMI, and can help you achieve or maintain a healthy weight.For adults 20 years and older:  A BMI below 18.5 is considered underweight.  A BMI of 18.5 to 24.9 is normal.  A BMI of 25 to 29.9 is considered overweight.  A BMI of 30 and above is  considered obese.  Maintain normal blood lipids and cholesterol levels by exercising and minimizing your intake of saturated fat. Eat a balanced diet with plenty of fruit and vegetables. Blood tests for lipids and cholesterol should begin at age 20 and be repeated every 5 years. If your lipid or cholesterol levels are high, you are over 50, or you are at high risk for heart disease, you may need your cholesterol levels checked more frequently.Ongoing high lipid and cholesterol levels should be treated with medicines if diet and exercise are not effective.  If you smoke, find out from your caregiver how to quit. If you do not use tobacco, do not start.  If you are pregnant, do not drink alcohol. If you are breastfeeding, be very cautious about drinking alcohol. If you are not pregnant and choose to drink alcohol, do not exceed 1 drink per day. One drink is considered to be 12 ounces (355 mL) of beer, 5 ounces (148 mL) of wine, or 1.5 ounces (44 mL) of liquor.  Avoid use of street drugs. Do not share needles with anyone. Ask for help if you need support or instructions about stopping the use of drugs.  High blood pressure causes heart disease and increases the risk of stroke. Your blood pressure should be checked at least every 1 to 2 years. Ongoing high blood pressure should be treated with medicines if weight loss and exercise are not effective.  If you are 55 to 38 years old, ask your caregiver if you should take aspirin to prevent strokes.  Diabetes   screening involves taking a blood sample to check your fasting blood sugar level. This should be done once every 3 years, after age 45, if you are within normal weight and without risk factors for diabetes. Testing should be considered at a younger age or be carried out more frequently if you are overweight and have at least 1 risk factor for diabetes.  Breast cancer screening is essential preventive care for women. You should practice "breast  self-awareness." This means understanding the normal appearance and feel of your breasts and may include breast self-examination. Any changes detected, no matter how small, should be reported to a caregiver. Women in their 20s and 30s should have a clinical breast exam (CBE) by a caregiver as part of a regular health exam every 1 to 3 years. After age 40, women should have a CBE every year. Starting at age 40, women should consider having a mammography (breast X-ray test) every year. Women who have a family history of breast cancer should talk to their caregiver about genetic screening. Women at a high risk of breast cancer should talk to their caregivers about having magnetic resonance imaging (MRI) and a mammography every year.  The Pap test is a screening test for cervical cancer. A Pap test can show cell changes on the cervix that might become cervical cancer if left untreated. A Pap test is a procedure in which cells are obtained and examined from the lower end of the uterus (cervix).  Women should have a Pap test starting at age 21.  Between ages 21 and 29, Pap tests should be repeated every 2 years.  Beginning at age 30, you should have a Pap test every 3 years as long as the past 3 Pap tests have been normal.  Some women have medical problems that increase the chance of getting cervical cancer. Talk to your caregiver about these problems. It is especially important to talk to your caregiver if a new problem develops soon after your last Pap test. In these cases, your caregiver may recommend more frequent screening and Pap tests.  The above recommendations are the same for women who have or have not gotten the vaccine for human papillomavirus (HPV).  If you had a hysterectomy for a problem that was not cancer or a condition that could lead to cancer, then you no longer need Pap tests. Even if you no longer need a Pap test, a regular exam is a good idea to make sure no other problems are  starting.  If you are between ages 65 and 70, and you have had normal Pap tests going back 10 years, you no longer need Pap tests. Even if you no longer need a Pap test, a regular exam is a good idea to make sure no other problems are starting.  If you have had past treatment for cervical cancer or a condition that could lead to cancer, you need Pap tests and screening for cancer for at least 20 years after your treatment.  If Pap tests have been discontinued, risk factors (such as a new sexual partner) need to be reassessed to determine if screening should be resumed.  The HPV test is an additional test that may be used for cervical cancer screening. The HPV test looks for the virus that can cause the cell changes on the cervix. The cells collected during the Pap test can be tested for HPV. The HPV test could be used to screen women aged 30 years and older, and should   be used in women of any age who have unclear Pap test results. After the age of 30, women should have HPV testing at the same frequency as a Pap test.  Colorectal cancer can be detected and often prevented. Most routine colorectal cancer screening begins at the age of 50 and continues through age 75. However, your caregiver may recommend screening at an earlier age if you have risk factors for colon cancer. On a yearly basis, your caregiver may provide home test kits to check for hidden blood in the stool. Use of a small camera at the end of a tube, to directly examine the colon (sigmoidoscopy or colonoscopy), can detect the earliest forms of colorectal cancer. Talk to your caregiver about this at age 50, when routine screening begins. Direct examination of the colon should be repeated every 5 to 10 years through age 75, unless early forms of pre-cancerous polyps or small growths are found.  Hepatitis C blood testing is recommended for all people born from 1945 through 1965 and any individual with known risks for hepatitis C.  Practice  safe sex. Use condoms and avoid high-risk sexual practices to reduce the spread of sexually transmitted infections (STIs). STIs include gonorrhea, chlamydia, syphilis, trichomonas, herpes, HPV, and human immunodeficiency virus (HIV). Herpes, HIV, and HPV are viral illnesses that have no cure. They can result in disability, cancer, and death. Sexually active women aged 25 and younger should be checked for chlamydia. Older women with new or multiple partners should also be tested for chlamydia. Testing for other STIs is recommended if you are sexually active and at increased risk.  Osteoporosis is a disease in which the bones lose minerals and strength with aging. This can result in serious bone fractures. The risk of osteoporosis can be identified using a bone density scan. Women ages 65 and over and women at risk for fractures or osteoporosis should discuss screening with their caregivers. Ask your caregiver whether you should take a calcium supplement or vitamin D to reduce the rate of osteoporosis.  Menopause can be associated with physical symptoms and risks. Hormone replacement therapy is available to decrease symptoms and risks. You should talk to your caregiver about whether hormone replacement therapy is right for you.  Use sunscreen with sun protection factor (SPF) of 30 or more. Apply sunscreen liberally and repeatedly throughout the day. You should seek shade when your shadow is shorter than you. Protect yourself by wearing long sleeves, pants, a wide-brimmed hat, and sunglasses year round, whenever you are outdoors.  Once a month, do a whole body skin exam, using a mirror to look at the skin on your back. Notify your caregiver of new moles, moles that have irregular borders, moles that are larger than a pencil eraser, or moles that have changed in shape or color.  Stay current with required immunizations.  Influenza. You need a dose every fall (or winter). The composition of the flu vaccine  changes each year, so being vaccinated once is not enough.  Pneumococcal polysaccharide. You need 1 to 2 doses if you smoke cigarettes or if you have certain chronic medical conditions. You need 1 dose at age 65 (or older) if you have never been vaccinated.  Tetanus, diphtheria, pertussis (Tdap, Td). Get 1 dose of Tdap vaccine if you are younger than age 65, are over 65 and have contact with an infant, are a healthcare worker, are pregnant, or simply want to be protected from whooping cough. After that, you need a Td   booster dose every 10 years. Consult your caregiver if you have not had at least 3 tetanus and diphtheria-containing shots sometime in your life or have a deep or dirty wound.  HPV. You need this vaccine if you are a woman age 26 or younger. The vaccine is given in 3 doses over 6 months.  Measles, mumps, rubella (MMR). You need at least 1 dose of MMR if you were born in 1957 or later. You may also need a second dose.  Meningococcal. If you are age 19 to 21 and a first-year college student living in a residence hall, or have one of several medical conditions, you need to get vaccinated against meningococcal disease. You may also need additional booster doses.  Zoster (shingles). If you are age 60 or older, you should get this vaccine.  Varicella (chickenpox). If you have never had chickenpox or you were vaccinated but received only 1 dose, talk to your caregiver to find out if you need this vaccine.  Hepatitis A. You need this vaccine if you have a specific risk factor for hepatitis A virus infection or you simply wish to be protected from this disease. The vaccine is usually given as 2 doses, 6 to 18 months apart.  Hepatitis B. You need this vaccine if you have a specific risk factor for hepatitis B virus infection or you simply wish to be protected from this disease. The vaccine is given in 3 doses, usually over 6 months. Preventive Services / Frequency Ages 19 to 39  Blood  pressure check.** / Every 1 to 2 years.  Lipid and cholesterol check.** / Every 5 years beginning at age 20.  Clinical breast exam.** / Every 3 years for women in their 20s and 30s.  Pap test.** / Every 2 years from ages 21 through 29. Every 3 years starting at age 30 through age 65 or 70 with a history of 3 consecutive normal Pap tests.  HPV screening.** / Every 3 years from ages 30 through ages 65 to 70 with a history of 3 consecutive normal Pap tests.  Hepatitis C blood test.** / For any individual with known risks for hepatitis C.  Skin self-exam. / Monthly.  Influenza immunization.** / Every year.  Pneumococcal polysaccharide immunization.** / 1 to 2 doses if you smoke cigarettes or if you have certain chronic medical conditions.  Tetanus, diphtheria, pertussis (Tdap, Td) immunization. / A one-time dose of Tdap vaccine. After that, you need a Td booster dose every 10 years.  HPV immunization. / 3 doses over 6 months, if you are 26 and younger.  Measles, mumps, rubella (MMR) immunization. / You need at least 1 dose of MMR if you were born in 1957 or later. You may also need a second dose.  Meningococcal immunization. / 1 dose if you are age 19 to 21 and a first-year college student living in a residence hall, or have one of several medical conditions, you need to get vaccinated against meningococcal disease. You may also need additional booster doses.  Varicella immunization.** / Consult your caregiver.  Hepatitis A immunization.** / Consult your caregiver. 2 doses, 6 to 18 months apart.  Hepatitis B immunization.** / Consult your caregiver. 3 doses usually over 6 months. Ages 40 to 64  Blood pressure check.** / Every 1 to 2 years.  Lipid and cholesterol check.** / Every 5 years beginning at age 20.  Clinical breast exam.** / Every year after age 40.  Mammogram.** / Every year beginning at age 40   and continuing for as long as you are in good health. Consult with your  caregiver.  Pap test.** / Every 3 years starting at age 30 through age 65 or 70 with a history of 3 consecutive normal Pap tests.  HPV screening.** / Every 3 years from ages 30 through ages 65 to 70 with a history of 3 consecutive normal Pap tests.  Fecal occult blood test (FOBT) of stool. / Every year beginning at age 50 and continuing until age 75. You may not need to do this test if you get a colonoscopy every 10 years.  Flexible sigmoidoscopy or colonoscopy.** / Every 5 years for a flexible sigmoidoscopy or every 10 years for a colonoscopy beginning at age 50 and continuing until age 75.  Hepatitis C blood test.** / For all people born from 1945 through 1965 and any individual with known risks for hepatitis C.  Skin self-exam. / Monthly.  Influenza immunization.** / Every year.  Pneumococcal polysaccharide immunization.** / 1 to 2 doses if you smoke cigarettes or if you have certain chronic medical conditions.  Tetanus, diphtheria, pertussis (Tdap, Td) immunization.** / A one-time dose of Tdap vaccine. After that, you need a Td booster dose every 10 years.  Measles, mumps, rubella (MMR) immunization. / You need at least 1 dose of MMR if you were born in 1957 or later. You may also need a second dose.  Varicella immunization.** / Consult your caregiver.  Meningococcal immunization.** / Consult your caregiver.  Hepatitis A immunization.** / Consult your caregiver. 2 doses, 6 to 18 months apart.  Hepatitis B immunization.** / Consult your caregiver. 3 doses, usually over 6 months. Ages 65 and over  Blood pressure check.** / Every 1 to 2 years.  Lipid and cholesterol check.** / Every 5 years beginning at age 20.  Clinical breast exam.** / Every year after age 40.  Mammogram.** / Every year beginning at age 40 and continuing for as long as you are in good health. Consult with your caregiver.  Pap test.** / Every 3 years starting at age 30 through age 65 or 70 with a 3  consecutive normal Pap tests. Testing can be stopped between 65 and 70 with 3 consecutive normal Pap tests and no abnormal Pap or HPV tests in the past 10 years.  HPV screening.** / Every 3 years from ages 30 through ages 65 or 70 with a history of 3 consecutive normal Pap tests. Testing can be stopped between 65 and 70 with 3 consecutive normal Pap tests and no abnormal Pap or HPV tests in the past 10 years.  Fecal occult blood test (FOBT) of stool. / Every year beginning at age 50 and continuing until age 75. You may not need to do this test if you get a colonoscopy every 10 years.  Flexible sigmoidoscopy or colonoscopy.** / Every 5 years for a flexible sigmoidoscopy or every 10 years for a colonoscopy beginning at age 50 and continuing until age 75.  Hepatitis C blood test.** / For all people born from 1945 through 1965 and any individual with known risks for hepatitis C.  Osteoporosis screening.** / A one-time screening for women ages 65 and over and women at risk for fractures or osteoporosis.  Skin self-exam. / Monthly.  Influenza immunization.** / Every year.  Pneumococcal polysaccharide immunization.** / 1 dose at age 65 (or older) if you have never been vaccinated.  Tetanus, diphtheria, pertussis (Tdap, Td) immunization. / A one-time dose of Tdap vaccine if you are over   65 and have contact with an infant, are a healthcare worker, or simply want to be protected from whooping cough. After that, you need a Td booster dose every 10 years.  Varicella immunization.** / Consult your caregiver.  Meningococcal immunization.** / Consult your caregiver.  Hepatitis A immunization.** / Consult your caregiver. 2 doses, 6 to 18 months apart.  Hepatitis B immunization.** / Check with your caregiver. 3 doses, usually over 6 months. ** Family history and personal history of risk and conditions may change your caregiver's recommendations. Document Released: 12/29/2001 Document Revised: 01/25/2012  Document Reviewed: 03/30/2011 [employer] Patient Information 2013 [employer].  

## 2013-03-10 NOTE — Assessment & Plan Note (Signed)
Stable on Effexor, is given a small refill on Alprazolam to use, has used it very infrequently

## 2013-03-12 NOTE — Progress Notes (Signed)
[name], female   DOB: [birth year], 37 y.o.   MRN: [id number] [name] [id number] 1976/10/04 03/12/2013      Progress Note-Follow Up  Subjective  Chief Complaint  Chief Complaint  Patient presents with  . Follow-up    HPI  Patient is a 37 year old Caucasian female who is here today for followup on her medications. Overall she's doing well. She does believe the venlafaxine helps her to manage her daily stressors. She was given a very small amount of alprazolam years ago and was she's used it very infrequently we will refill today for when necessary use. He works when she needs it. No other acute complaints. Does have some allergies but uses her Flonase as needed with adequate results. No recent illness. No fevers, chest pain, palpitations, shortness of, GI or GU concerns noted today.  Past Medical History  Diagnosis Date  . Allergy     seasonal  . ANXIETY DEPRESSION 11/21/2010  . ALLERGIC RHINITIS 11/21/2010  . Other specified anemias 11/21/2010  . Anxiety   . Asthma   . Mastitis 06/14/2012  . Dehydration 06/14/2012    History reviewed. No pertinent past surgical history.  Family History  Problem Relation Age of Onset  . Hyperlipidemia Mother   . Osteoarthritis Mother   . Diabetes Father     Type 2  . Ulcers Father   . Migraines Sister   . Bipolar disorder Brother   . Panic disorder Brother   . Arthritis Maternal Grandmother   . Cancer Maternal Grandmother     uterine  . Cancer Maternal Grandfather   . Heart disease Maternal Grandfather   . Heart attack Maternal Grandfather   . Diabetes Paternal Grandmother   . Hypertension Paternal Grandmother   . Cancer Paternal Grandfather     multiple myeloma  . Anesthesia problems Neg Hx   . Hypotension Neg Hx   . Malignant hyperthermia Neg Hx   . Pseudochol deficiency Neg Hx     History   Social History  . Marital Status: Married    Spouse Name: N/A    Number of Children: N/A  . Years of  Education: N/A   Occupational History  . Not on file.   Social History Main Topics  . Smoking status: Never Smoker   . Smokeless tobacco: Never Used  . Alcohol Use: 0.0 oz/week    0 drink(s) per week     Comment: rare occasional glass of wine  . Drug Use: No  . Sexually Active: Yes   Other Topics Concern  . Not on file   Social History Narrative  . No narrative on file    Current Outpatient Prescriptions on File Prior to Visit  Medication Sig Dispense Refill  . acetaminophen (TYLENOL) 650 MG CR tablet Take 1,300 mg by mouth every 8 (eight) hours as needed. For pain      . albuterol (PROAIR HFA) 108 (90 BASE) MCG/ACT inhaler Inhale 2 puffs into the lungs every 6 (six) hours as needed. For wheezing       . dibucaine (NUPERCAINAL) 1 % OINT Place 1 application rectally as needed.  1 Tube  4  . fluticasone (FLONASE) 50 MCG/ACT nasal spray USE 2 SPRAYS IN EACH NOSTRIL EVERY DAY  16 g  1  . predniSONE (DELTASONE) 20 MG tablet Take 1 tablet (20 mg total) by mouth as directed. Take 2 pills daily X 5 days, 1 pill daily X 5 days, 1/2 pill daily X 5 days  20 tablet  0   No current facility-administered medications on file prior to visit.    No Known Allergies  Review of Systems  Review of Systems  Constitutional: Negative for fever and malaise/fatigue.  HENT: Positive for congestion.   Eyes: Negative for discharge.  Respiratory: Negative for shortness of breath.   Cardiovascular: Negative for chest pain, palpitations and leg swelling.  Gastrointestinal: Negative for nausea, abdominal pain and diarrhea.  Genitourinary: Negative for dysuria.  Musculoskeletal: Negative for falls.  Skin: Negative for rash.  Neurological: Negative for loss of consciousness and headaches.  Endo/Heme/Allergies: Negative for polydipsia.  Psychiatric/Behavioral: Negative for depression and suicidal ideas. The patient is nervous/anxious. The patient does not have insomnia.     Objective  BP 118/79   Pulse 108  Temp(Src) 97.5 F (36.4 C) (Oral)  Ht 5\' 9"  (1.753 m)  Wt 146 lb 8 oz (66.452 kg)  BMI 21.62 kg/m2  SpO2 98%  LMP 03/07/2013  Breastfeeding? Yes  Physical Exam  Physical Exam  Constitutional: She is oriented to person, place, and time and well-developed, well-nourished, and in no distress. No distress.  HENT:  Head: Normocephalic and atraumatic.  Eyes: Conjunctivae are normal.  Neck: Neck supple. No thyromegaly present.  Cardiovascular: Normal rate, regular rhythm and normal heart sounds.   No murmur heard. Pulmonary/Chest: Effort normal and breath sounds normal. She has no wheezes.  Abdominal: She exhibits no distension and no mass.  Musculoskeletal: She exhibits no edema.  Lymphadenopathy:    She has no cervical adenopathy.  Neurological: She is alert and oriented to person, place, and time.  Skin: Skin is warm and dry. No rash noted. She is not diaphoretic.  Psychiatric: Memory, affect and judgment normal.    Lab Results  Component Value Date   TSH 0.53 06/14/2012   Lab Results  Component Value Date   WBC 14.8* 06/14/2012   HGB 12.7 06/14/2012   HCT 37.9 06/14/2012   MCV 80.8 06/14/2012   PLT 129.0* 06/14/2012   Lab Results  Component Value Date   CREATININE 1.0 06/14/2012   BUN 15 06/14/2012   NA 139 06/14/2012   K 3.9 06/14/2012   CL 101 06/14/2012   CO2 27 06/14/2012   Lab Results  Component Value Date   ALT 5 01/17/2012   AST 13 01/17/2012   ALKPHOS 61 01/17/2012   BILITOT 0.1* 01/17/2012    Assessment & Plan  ANXIETY DEPRESSION Stable on Effexor, is given a small refill on Alprazolam to use, has used it very infrequently  ALLERGIC RHINITIS Generally well controlled with current meds, may continue prn use

## 2013-03-12 NOTE — Assessment & Plan Note (Signed)
Generally well controlled with current meds, may continue prn use

## 2013-03-14 ENCOUNTER — Other Ambulatory Visit (INDEPENDENT_AMBULATORY_CARE_PROVIDER_SITE_OTHER): Payer: 59

## 2013-03-14 DIAGNOSIS — Z0000 Encounter for general adult medical examination without abnormal findings: Secondary | ICD-10-CM

## 2013-03-14 LAB — LIPID PANEL
HDL: 63.8 mg/dL (ref >39.00)
LDL Cholesterol: 113 mg/dL — ABNORMAL HIGH (ref 0–99)
Total CHOL/HDL Ratio: 3
VLDL: 10.8 mg/dL (ref 0.0–40.0)

## 2013-03-14 LAB — TSH: TSH: 0.86 u[IU]/mL (ref 0.35–5.50)

## 2013-03-14 LAB — RENAL FUNCTION PANEL
Albumin: 3.9 g/dL (ref 3.5–5.2)
BUN: 12 mg/dL (ref 6–23)
CO2: 31 mEq/L (ref 19–32)
Calcium: 9.2 mg/dL (ref 8.4–10.5)
Chloride: 106 mEq/L (ref 96–112)

## 2013-03-14 LAB — HEPATIC FUNCTION PANEL
Alkaline Phosphatase: 36 U/L — ABNORMAL LOW (ref 39–117)
Bilirubin, Direct: 0.1 mg/dL (ref 0.0–0.3)
Total Protein: 6.3 g/dL (ref 6.0–8.3)

## 2013-03-14 LAB — CBC
MCHC: 34.1 g/dL (ref 30.0–36.0)
RDW: 13.7 % (ref 11.5–14.6)

## 2013-03-14 NOTE — Progress Notes (Signed)
Labs only

## 2013-03-15 ENCOUNTER — Other Ambulatory Visit (HOSPITAL_COMMUNITY)
Admission: RE | Admit: 2013-03-15 | Discharge: 2013-03-15 | Disposition: A | Discharge status: Home / Self Care | Payer: 59 | Source: Ambulatory Visit | Attending: Family Medicine | Admitting: Family Medicine

## 2013-03-15 ENCOUNTER — Ambulatory Visit (INDEPENDENT_AMBULATORY_CARE_PROVIDER_SITE_OTHER): Payer: 59 | Admitting: Family Medicine

## 2013-03-15 ENCOUNTER — Encounter: Payer: Self-pay | Admitting: Family Medicine

## 2013-03-15 VITALS — BP 114/77 | HR 99 | Temp 98.0°F | Ht 69.0 in | Wt 148.0 lb

## 2013-03-15 DIAGNOSIS — Z124 Encounter for screening for malignant neoplasm of cervix: Secondary | ICD-10-CM

## 2013-03-15 DIAGNOSIS — Z01419 Encounter for gynecological examination (general) (routine) without abnormal findings: Secondary | ICD-10-CM | POA: Insufficient documentation

## 2013-03-15 DIAGNOSIS — J45909 Unspecified asthma, uncomplicated: Secondary | ICD-10-CM

## 2013-03-15 DIAGNOSIS — J309 Allergic rhinitis, unspecified: Secondary | ICD-10-CM

## 2013-03-15 DIAGNOSIS — F341 Dysthymic disorder: Secondary | ICD-10-CM

## 2013-03-15 DIAGNOSIS — D6489 Other specified anemias: Secondary | ICD-10-CM

## 2013-03-18 ENCOUNTER — Encounter: Payer: Self-pay | Admitting: Family Medicine

## 2013-03-18 DIAGNOSIS — J45909 Unspecified asthma, uncomplicated: Secondary | ICD-10-CM

## 2013-03-18 HISTORY — DX: Unspecified asthma, uncomplicated: J45.909

## 2013-03-18 NOTE — Assessment & Plan Note (Signed)
Resolved with recent labs. 

## 2013-03-18 NOTE — Assessment & Plan Note (Signed)
Albuterol prn

## 2013-03-18 NOTE — Assessment & Plan Note (Signed)
Zyrtec daily

## 2013-03-18 NOTE — Progress Notes (Signed)
[name], female   DOB: 11-26-1975, 37 y.o.   MRN: [id number] [name] [id number] 10/07/1976 03/18/2013      Progress Note-Follow Up  Subjective  Chief Complaint  Chief Complaint  Patient presents with  . Gynecologic Exam    pap    HPI  Patient is a 37 year old in today for annual GYN exam. Minimal chiefly concerns. No discharge or lesions. No GI or GU complaints. She has had some mild trouble with her allergies recently but notes this happens he spring to some extent. It is somewhat worse than usual. She is having some coughing and wheezing at times. No recent illness. No fevers or chills. No chest pain, palpitations, shortness of breath, GI or GU concerns.  Past Medical History  Diagnosis Date  . Allergy     seasonal  . ANXIETY DEPRESSION 11/21/2010  . ALLERGIC RHINITIS 11/21/2010  . Other specified anemias 11/21/2010  . Anxiety   . Asthma   . Mastitis 06/14/2012  . Dehydration 06/14/2012    No past surgical history on file.  Family History  Problem Relation Age of Onset  . Hyperlipidemia Mother   . Osteoarthritis Mother   . Diabetes Father     Type 2  . Ulcers Father   . Migraines Sister   . Bipolar disorder Brother   . Panic disorder Brother   . Arthritis Maternal Grandmother   . Cancer Maternal Grandmother     uterine  . Cancer Maternal Grandfather   . Heart disease Maternal Grandfather   . Heart attack Maternal Grandfather   . Diabetes Paternal Grandmother   . Hypertension Paternal Grandmother   . Cancer Paternal Grandfather     multiple myeloma  . Anesthesia problems Neg Hx   . Hypotension Neg Hx   . Malignant hyperthermia Neg Hx   . Pseudochol deficiency Neg Hx     History   Social History  . Marital Status: Married    Spouse Name: N/A    Number of Children: N/A  . Years of Education: N/A   Occupational History  . Not on file.   Social History Main Topics  . Smoking status: Never Smoker   . Smokeless tobacco: Never Used   . Alcohol Use: 0.0 oz/week    0 drink(s) per week     Comment: rare occasional glass of wine  . Drug Use: No  . Sexually Active: Yes   Other Topics Concern  . Not on file   Social History Narrative  . No narrative on file    Current Outpatient Prescriptions on File Prior to Visit  Medication Sig Dispense Refill  . acetaminophen (TYLENOL) 650 MG CR tablet Take 1,300 mg by mouth every 8 (eight) hours as needed. For pain      . albuterol (PROAIR HFA) 108 (90 BASE) MCG/ACT inhaler Inhale 2 puffs into the lungs every 6 (six) hours as needed. For wheezing       . ALPRAZolam (XANAX) 0.25 MG tablet 1/2 to 1 tab po q 12 hours prn anxiety, minimize dosing while breast  20 tablet  1  . fluticasone (FLONASE) 50 MCG/ACT nasal spray USE 2 SPRAYS IN EACH NOSTRIL EVERY DAY  16 g  1  . venlafaxine XR (EFFEXOR-XR) 150 MG 24 hr capsule Take 1 capsule (150 mg total) by mouth daily.  30 capsule  11   No current facility-administered medications on file prior to visit.    No Known Allergies  Review of Systems  Review of Systems  Constitutional: Negative for fever and malaise/fatigue.  HENT: Negative for congestion.   Eyes: Negative for discharge.  Respiratory: Negative for shortness of breath.   Cardiovascular: Negative for chest pain, palpitations and leg swelling.  Gastrointestinal: Negative for nausea, abdominal pain and diarrhea.  Genitourinary: Negative for dysuria.  Musculoskeletal: Negative for falls.  Skin: Negative for rash.  Neurological: Negative for loss of consciousness and headaches.  Endo/Heme/Allergies: Negative for polydipsia.  Psychiatric/Behavioral: Negative for depression and suicidal ideas. The patient is not nervous/anxious and does not have insomnia.     Objective  BP 114/77  Pulse 99  Temp(Src) 98 F (36.7 C) (Temporal)  Ht 5\' 9"  (1.753 m)  Wt 148 lb (67.132 kg)  BMI 21.85 kg/m2  SpO2 98%  LMP 03/07/2013  Physical Exam  Physical Exam  Constitutional: She  is oriented to person, place, and time and well-developed, well-nourished, and in no distress. No distress.  HENT:  Head: Normocephalic and atraumatic.  Eyes: Conjunctivae are normal.  Neck: Neck supple. No thyromegaly present.  Cardiovascular: Normal rate, regular rhythm and normal heart sounds.   No murmur heard. Pulmonary/Chest: Effort normal and breath sounds normal. She has no wheezes.  Abdominal: She exhibits no distension and no mass.  Genitourinary: Vagina normal, uterus normal, cervix normal, right adnexa normal and left adnexa normal. No vaginal discharge found.  Breast exam unremarkable, no concerns. No discharge, skin changes or lesions  Musculoskeletal: She exhibits no edema.  Lymphadenopathy:    She has no cervical adenopathy.  Neurological: She is alert and oriented to person, place, and time.  Skin: Skin is warm and dry. No rash noted. She is not diaphoretic.  Psychiatric: Memory, affect and judgment normal.    Lab Results  Component Value Date   TSH 0.86 03/14/2013   Lab Results  Component Value Date   WBC 3.8* 03/14/2013   HGB 13.2 03/14/2013   HCT 38.6 03/14/2013   MCV 87.3 03/14/2013   PLT 147.0* 03/14/2013   Lab Results  Component Value Date   CREATININE 0.8 03/14/2013   BUN 12 03/14/2013   NA 141 03/14/2013   K 4.0 03/14/2013   CL 106 03/14/2013   CO2 31 03/14/2013   Lab Results  Component Value Date   ALT 9 03/14/2013   AST 15 03/14/2013   ALKPHOS 36* 03/14/2013   BILITOT 0.7 03/14/2013   Lab Results  Component Value Date   CHOL 188 03/14/2013   Lab Results  Component Value Date   HDL 63.80 03/14/2013   Lab Results  Component Value Date   LDLCALC 113* 03/14/2013   Lab Results  Component Value Date   TRIG 54.0 03/14/2013   Lab Results  Component Value Date   CHOLHDL 3 03/14/2013     Assessment & Plan  Cervical cancer screening Pap taken today, no concerns noted on exam  OTHER SPECIFIED ANEMIAS Resolved with recent labs  ANXIETY  DEPRESSION Doing well on current meds.  ALLERGIC RHINITIS Zyrtec daily  Unspecified asthma Albuterol prn

## 2013-03-18 NOTE — Assessment & Plan Note (Signed)
Doing well on current meds.

## 2013-03-18 NOTE — Assessment & Plan Note (Signed)
Pap taken today, no concerns noted on exam

## 2013-03-20 NOTE — Progress Notes (Signed)
Quick Note:  Patient Informed and voiced understanding ______ 

## 2013-04-11 ENCOUNTER — Other Ambulatory Visit: Payer: Self-pay | Admitting: Family Medicine

## 2013-05-02 ENCOUNTER — Telehealth: Payer: Self-pay | Admitting: Family Medicine

## 2013-05-02 NOTE — Telephone Encounter (Signed)
REFILL VENLAFAXINE HCL ER 150 MG CAP QTY 30 TAKE 1 CAPSULE BY MOUTH EVERY DAY LAST FILL 12-26-2012

## 2013-05-02 NOTE — Telephone Encounter (Signed)
Effexor request [Last Rx 04.25.14 #[id number]]--Too soon for refill request/SLS

## 2013-08-03 ENCOUNTER — Other Ambulatory Visit: Payer: Self-pay

## 2013-08-03 MED ORDER — ALBUTEROL SULFATE HFA 108 (90 BASE) MCG/ACT IN AERS: 2.0000 | INHALATION_SPRAY | Freq: Four times a day (QID) | RESPIRATORY_TRACT | Status: DC | PRN

## 2013-08-03 NOTE — Telephone Encounter (Signed)
Received call from pharmacy that they needed to know how many days the inhaler is supposed to last.  Pharmacist says "days supply is not the issue because they have 30 days listed as the day supply". Advised pharmacist that fax we received is for Ventolin and pt's med list states proair. Pharmacist ran claim for proair and it processed for $3 copay.

## 2013-08-03 NOTE — Telephone Encounter (Signed)
Per md refill Proair

## 2013-08-23 ENCOUNTER — Other Ambulatory Visit: Payer: Self-pay | Admitting: Family Medicine

## 2013-08-23 ENCOUNTER — Ambulatory Visit (INDEPENDENT_AMBULATORY_CARE_PROVIDER_SITE_OTHER): Payer: 59

## 2013-08-23 DIAGNOSIS — Z23 Encounter for immunization: Secondary | ICD-10-CM

## 2013-08-23 NOTE — Telephone Encounter (Signed)
Rx request to pharmacy/SLS  

## 2014-01-15 ENCOUNTER — Telehealth: Payer: Self-pay | Admitting: Family Medicine

## 2014-01-15 MED ORDER — FLUTICASONE PROPIONATE 50 MCG/ACT NA SUSP: 2.0000 | Freq: Every day | NASAL | Status: DC

## 2014-01-15 NOTE — Telephone Encounter (Signed)
Refill fluticasone

## 2014-01-26 ENCOUNTER — Telehealth: Payer: Self-pay | Admitting: Family Medicine

## 2014-01-26 DIAGNOSIS — F329 Major depressive disorder, single episode, unspecified: Secondary | ICD-10-CM

## 2014-01-26 DIAGNOSIS — F419 Anxiety disorder, unspecified: Principal | ICD-10-CM

## 2014-01-26 MED ORDER — VENLAFAXINE HCL ER 150 MG PO CP24: 150.0000 mg | ORAL_CAPSULE | Freq: Every day | ORAL | Status: DC

## 2014-01-26 NOTE — Telephone Encounter (Signed)
Refill- vanlafaxine

## 2014-01-26 NOTE — Telephone Encounter (Signed)
Please inform pt that we sent in a 30 day supply and she will need an appt for additional refills. We haven't seen patient since 65-14

## 2014-01-29 ENCOUNTER — Telehealth: Payer: Self-pay | Admitting: Family Medicine

## 2014-01-29 NOTE — Telephone Encounter (Signed)
NEEDS VENLAFAXINE ER 150MG  AND 75 MG CALLED IN TO THE MAIL ORDER PHARMACY.

## 2014-01-29 NOTE — Telephone Encounter (Signed)
We have no mail order pharmacy on record. We need to know what mail order pharmacy

## 2014-02-01 ENCOUNTER — Other Ambulatory Visit: Payer: Self-pay | Admitting: Family Medicine

## 2014-02-01 DIAGNOSIS — F419 Anxiety disorder, unspecified: Principal | ICD-10-CM

## 2014-02-01 DIAGNOSIS — F329 Major depressive disorder, single episode, unspecified: Secondary | ICD-10-CM

## 2014-02-01 MED ORDER — VENLAFAXINE HCL ER 225 MG PO TB24: 225.0000 mg | ORAL_TABLET | Freq: Every day | ORAL | Status: DC

## 2014-02-01 NOTE — Telephone Encounter (Signed)
Patient requesting venlafaxine refill.  She was taking 150mg  combined with 75 mg.  Patient would like to know if she can get a 225 MG tab instead?   Patient would also like a 90 day supply sent to [facility].   Please advise refill.

## 2014-02-01 NOTE — Telephone Encounter (Signed)
30 day supply sent to local pharmacy and 90 day supply sent to mail order x 1 refill.  Pt's husband aware.

## 2014-02-01 NOTE — Telephone Encounter (Signed)
OK to switch her to Venlafaxine XR 225 mg tab 1 tab po daily, can send a 30 day supply local and a 90 day supply with 1 rf to her requested mail order pharmacy

## 2014-03-19 ENCOUNTER — Ambulatory Visit (INDEPENDENT_AMBULATORY_CARE_PROVIDER_SITE_OTHER): Payer: 59 | Admitting: Nurse Practitioner

## 2014-03-19 ENCOUNTER — Encounter: Payer: Self-pay | Admitting: Nurse Practitioner

## 2014-03-19 ENCOUNTER — Telehealth: Payer: Self-pay | Admitting: Nurse Practitioner

## 2014-03-19 VITALS — BP 119/85 | HR 112 | Temp 97.8°F | Ht 69.0 in | Wt 146.8 lb

## 2014-03-19 DIAGNOSIS — R109 Unspecified abdominal pain: Secondary | ICD-10-CM | POA: Insufficient documentation

## 2014-03-19 DIAGNOSIS — F4010 Social phobia, unspecified: Secondary | ICD-10-CM

## 2014-03-19 MED ORDER — VENLAFAXINE HCL 75 MG PO TABS: ORAL_TABLET | ORAL | Status: DC

## 2014-03-19 MED ORDER — LORAZEPAM 0.5 MG PO TABS: 0.5000 mg | ORAL_TABLET | Freq: Every day | ORAL | Status: DC | PRN

## 2014-03-19 MED ORDER — ESCITALOPRAM OXALATE 20 MG PO TABS: ORAL_TABLET | ORAL | Status: DC

## 2014-03-19 MED ORDER — ESCITALOPRAM OXALATE 10 MG PO TABS: ORAL_TABLET | ORAL | Status: DC

## 2014-03-19 NOTE — Assessment & Plan Note (Signed)
Lexapro effective in past.  Will transition off effexor to lexapro. D/c xanax. Start ativan SL PRN. Still nursing. Discussed safety risk & benefit. F/u 6 weeks.

## 2014-03-19 NOTE — Telephone Encounter (Signed)
Discussed med changes w/pt.

## 2014-03-19 NOTE — Progress Notes (Signed)
Pre visit review using our clinic review tool, if applicable. No additional management support is needed unless otherwise documented below in the visit note. 

## 2014-03-19 NOTE — Assessment & Plan Note (Signed)
DD: abdominal migraine, gallbladder disease, constipation GI Referral for further eval & w/u.

## 2014-03-19 NOTE — Patient Instructions (Addendum)
  I encourage you to use ativan for break through anxiety-dissolve under tongue for relief in 15 minutes rather than 30 minutes to 1 hour. Take first tablet when you are home and husband is there to help w/kids. You may feel tired when first taking, but that may go away.  To transition from effexor to lexapro: the process will take about 12 days.  We will decrease effexor as we increase lexapro. Days 1-3: take 1/2 T lexapro daily & 1T 75 mg effexor twice daily. Days 4-6: take 1T lexapro daily & 1/2 T 225 mg effexor daily Days 7-9: take 1 1/2 T lexapro daily & 1T 75 mg effexor daily Days 10-12: take 2T lexapro daily & 1/2 T 75 mg effexor daily    Also be creative about carving some time for yourself weekly-1 hr to read, walk in park, etc..away from demands of house & home. Time to recharge, decompress.  I will refer to GI-they will call to set up appointment. Great to see you!

## 2014-03-19 NOTE — Progress Notes (Signed)
Subjective:     Tria AZALEAH USMAN is a 38 y.o. female who presents for follow up of anxiety disorder. Current symptoms: difficulty concentrating, dizziness, fatigue, shortness of breath, feelings of disassociation.Episodes are triggered by new social situations & [place] on [address] This is longstanding problem-at least 10 years. Additionally, she remembers feeling "sick" as a child when she went to friend's homes.  Current med: effexor 225 mg qd. She does not feel it is working as well as it has in past.: She is having more episodes of anxiety. She is hesitant to use xanax due to not wanting to feel "out of it". She has used zoloft in past with minimal relief. She used lexapro for a few years successfully, but went off the medication due to increase in anxiety. Of note, she had just delivered baby-life event. She is a busy mother of six.  She also c/o recurrent abdominal pain. Episodes are random, occurring every 3 to 6 mos. and last 4 hours followed by 24h of nausea and fatigue. Pain is located mid abdomen and is constant, crampy. It is not associated with diarrhea, constipation, belching or eating. Zofran is not helpful for nausea. Tylenol & ibuprophen are not helpful for pain. Phenergan IM 25 mg helps because she is able to sleep through episode & several hours after. She has history of migraine HA & her mother reports similar episodes when in her 36's.   The following portions of the patient's history were reviewed and updated as appropriate: allergies, current medications, past family history, past medical history, past social history, past surgical history and problem list.    Objective:    BP 119/85  Pulse 112  Temp(Src) 97.8 F (36.6 C) (Temporal)  Ht 5\' 9"  (1.753 m)  Wt 146 lb 12 oz (66.565 kg)  BMI 21.66 kg/m2  SpO2 98%  General:  alert, cooperative, appears stated age and no distress  Affect/Behavior:   GI:  full facial expressions, good grooming, good insight, normal perception,  normal reasoning, normal speech pattern and content and normal thought patterns  Flat, soft. NO HSM. May feel stool L LQ. Mild suprapubic tenderness-reports on [facility].       Assessment & Plan:  1. Abdominal pain, unspecified site - Ambulatory referral to Gastroenterology  2. Social anxiety disorder - LORazepam (ATIVAN) 0.5 MG tablet; Place 1 tablet (0.5 mg total) under the tongue daily as needed for anxiety.  Dispense: 30 tablet; Refill: 1 WEAN OFF EFFEXOR, START LEXAPRO - escitalopram (LEXAPRO) 10 MG tablet; Take 1/2T po qd X 3d, then take 1T PO QD X 3d, then take 1 1/2 T PO QD X 3d, then take 2T po qd.  Dispense: 50 tablet; Refill: 0 - venlafaxine (EFFEXOR) 75 MG tablet; Take 1T po BID X 3d, Stop for 3d (while taking 1/2 T 225 mg tab X3d), then take 1T po X 3d, then 1/2 T po X 3 days, then d/c.  Dispense: 11 tablet; Refill: 0 See pt instructions. F/u 6 weeks See problem list for complete A&P

## 2014-03-20 ENCOUNTER — Encounter: Payer: Self-pay | Admitting: Internal Medicine

## 2014-03-20 NOTE — Telephone Encounter (Signed)
Opened in error

## 2014-03-21 ENCOUNTER — Other Ambulatory Visit: Payer: Self-pay

## 2014-03-22 ENCOUNTER — Ambulatory Visit (INDEPENDENT_AMBULATORY_CARE_PROVIDER_SITE_OTHER): Payer: 59 | Admitting: Nurse Practitioner

## 2014-03-22 ENCOUNTER — Telehealth: Payer: Self-pay | Admitting: Internal Medicine

## 2014-03-22 ENCOUNTER — Encounter: Payer: Self-pay | Admitting: Nurse Practitioner

## 2014-03-22 ENCOUNTER — Other Ambulatory Visit (INDEPENDENT_AMBULATORY_CARE_PROVIDER_SITE_OTHER): Payer: 59

## 2014-03-22 VITALS — BP 120/74 | HR 68 | Ht 69.0 in | Wt 145.0 lb

## 2014-03-22 DIAGNOSIS — R109 Unspecified abdominal pain: Secondary | ICD-10-CM

## 2014-03-22 DIAGNOSIS — R112 Nausea with vomiting, unspecified: Secondary | ICD-10-CM

## 2014-03-22 LAB — AMYLASE: Amylase: 41 U/L (ref 27–131)

## 2014-03-22 LAB — CBC
HCT: 39.2 % (ref 36.0–46.0)
HEMOGLOBIN: 13.3 g/dL (ref 12.0–15.0)
MCHC: 33.9 g/dL (ref 30.0–36.0)
MCV: 87.8 fl (ref 78.0–100.0)
Platelets: 210 10*3/uL (ref 150.0–400.0)
RBC: 4.47 Mil/uL (ref 3.87–5.11)
RDW: 14.1 % (ref 11.5–15.5)
WBC: 9.4 10*3/uL (ref 4.0–10.5)

## 2014-03-22 LAB — HEPATIC FUNCTION PANEL
ALT: 48 U/L — ABNORMAL HIGH (ref 0–35)
AST: 30 U/L (ref 0–37)
Albumin: 3.7 g/dL (ref 3.5–5.2)
Alkaline Phosphatase: 191 U/L — ABNORMAL HIGH (ref 39–117)
Bilirubin, Direct: 0.1 mg/dL (ref 0.0–0.3)
TOTAL PROTEIN: 7.1 g/dL (ref 6.0–8.3)
Total Bilirubin: 0.6 mg/dL (ref 0.2–1.2)

## 2014-03-22 LAB — LIPASE: Lipase: 23 U/L (ref 11.0–59.0)

## 2014-03-22 MED ORDER — HYDROCODONE-ACETAMINOPHEN 5-325 MG PO TABS: 1.0000 | ORAL_TABLET | Freq: Four times a day (QID) | ORAL | Status: DC | PRN

## 2014-03-22 MED ORDER — PROMETHAZINE HCL 25 MG RE SUPP: 25.0000 mg | RECTAL | Status: DC | PRN

## 2014-03-22 MED ORDER — PROMETHAZINE HCL 25 MG RE SUPP
25.0000 mg | RECTAL | Status: DC | PRN
Start: 2014-03-22 — End: 2015-08-28

## 2014-03-22 MED ORDER — KETOROLAC TROMETHAMINE 10 MG PO TABS: 10.0000 mg | ORAL_TABLET | Freq: Four times a day (QID) | ORAL | Status: DC | PRN

## 2014-03-22 NOTE — Telephone Encounter (Signed)
Note not needed 

## 2014-03-22 NOTE — Patient Instructions (Addendum)
Your physician has requested that you go to the basement for the following lab work before leaving today: Lipase Amylase CBC Hepatic Function Panel   We have sent the following medications to your pharmacy for you to pick up at your convenience: Phenergan  Toradol  Vicodin(Prescription printed and given to you to take to pharmacy)   You have been scheduled for an abdominal ultrasound at [facility] on 03-23-2014 at 1130 am. Please arrive 15 minutes prior to your appointment for registration. Make certain not to have anything to eat or drink 6 hours prior to your appointment. Should you need to reschedule your appointment, please contact radiology at [phone] or [phone]. This test typically takes about 30 minutes to perform.

## 2014-03-22 NOTE — Progress Notes (Addendum)
HPI :   Patient is a 38 year old female , new to this practice, referred for evaluation of abdominal pain. Patient has an appointment to see Dr. Hilarie Fredrickson next month but wanted to be sooner.   Alayja gives a 10-12 year history of intermittent abdominal pain. When episodes initially began they were infrequent, sometimes months apart. Now episodes are every 1-2 months. The pain is described as diffuse upper abdominal pain. Pain comes on rather suddenly, progresses over a 4 hour period and culminates in nausea and vomiting. Following that, pain steadily improves and patient is typically back to normal the next day. There is no associated bloating or bowel changes. She does take Toradol during the episodes and it seems to help. The pain is not related to meals. She cannot relate the pain to anything. Since episodes are short lived and follow a typical pattern patient has never sought medical treatment. Patient feels completely fine in between episodes. Her last episode was just a few days ago. She and her husband decided to come in for evaluation and this last episode has been a little different. She is having some residual abdominal discomfort and mild bloating as well.   Patient has a history of panic attacks, especially when in pubic or driving with kids in car. She has 6 kids, including a two year old who she still breast feeds. 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 Her panic attacks are not related to episodes of abdominal pain.   Past Medical History  Diagnosis Date  . Allergy     seasonal  . ANXIETY DEPRESSION 11/21/2010  . ALLERGIC RHINITIS 11/21/2010  . Other specified anemias 11/21/2010  . Anxiety   . Asthma   . Mastitis 06/14/2012  . Dehydration 06/14/2012  . Unspecified asthma(493.90) 03/18/2013    Family History  Problem Relation Age of Onset  . Hyperlipidemia Mother   . Osteoarthritis Mother   . Diabetes Father     Type 2  . Ulcers Father   . Migraines Sister   . Bipolar disorder Brother   .  Panic disorder Brother   . Arthritis Maternal Grandmother   . Cancer Maternal Grandmother     uterine  . Cancer Maternal Grandfather   . Heart disease Maternal Grandfather   . Heart attack Maternal Grandfather   . Diabetes Paternal Grandmother   . Hypertension Paternal Grandmother   . Cancer Paternal Grandfather     multiple myeloma  . Anesthesia problems Neg Hx   . Hypotension Neg Hx   . Malignant hyperthermia Neg Hx   . Pseudochol deficiency Neg Hx    History  Substance Use Topics  . Smoking status: Never Smoker   . Smokeless tobacco: Never Used  . Alcohol Use: 0.0 oz/week    0 drink(s) per week     Comment: rare occasional glass of wine   Current Outpatient Prescriptions  Medication Sig Dispense Refill  . acetaminophen (TYLENOL) 650 MG CR tablet Take 1,300 mg by mouth every 8 (eight) hours as needed. For pain      . albuterol (PROAIR HFA) 108 (90 BASE) MCG/ACT inhaler Inhale 2 puffs into the lungs every 6 (six) hours as needed. For wheezing  18 g  1  . escitalopram (LEXAPRO) 10 MG tablet Take 1/2T po qd X 3d, then take 1T PO QD X 3d, then take 1 1/2 T PO QD X 3d, then take 2T po qd.  50 tablet  0  . fluticasone (FLONASE) 50 MCG/ACT nasal spray Place  2 sprays into both nostrils daily.  16 g  0  . LORazepam (ATIVAN) 0.5 MG tablet Place 1 tablet (0.5 mg total) under the tongue daily as needed for anxiety.  30 tablet  1  . venlafaxine (EFFEXOR) 75 MG tablet Take 1T po BID X 3d, Stop for 3d (while taking 1/2 T 225 mg tab X3d), then take 1T po X 3d, then 1/2 T po X 3 days, then d/c.  11 tablet  0   No current facility-administered medications for this visit.   No Known Allergies  Review of Systems: All systems reviewed and negative except where noted in HPI.   Physical Exam: BP 120/74  Pulse 68  Ht 5' 9"  (1.753 m)  Wt 145 lb (65.772 kg)  BMI 21.40 kg/m2 Constitutional: Pleasant,well-developed, white female in no acute distress. HEENT: Normocephalic and atraumatic.  Conjunctivae are normal. No scleral icterus. Neck supple.  Cardiovascular: Normal rate, regular rhythm.  Pulmonary/chest: Effort normal and breath sounds normal. No wheezing, rales or rhonchi. Abdominal: Soft, nondistended, mild diffuse lower tenderness. . Bowel sounds active throughout. There are no masses palpable. No hepatomegaly. Extremities: no edema Lymphadenopathy: No cervical adenopathy noted. Neurological: Alert and oriented to person place and time. Skin: Skin is warm and dry. No rashes noted. Psychiatric: Normal mood and affect. Behavior is normal.   ASSESSMENT AND PLAN: 44. 38 year old female with a 10-12 year history of episodic upper abdominal pain associated with nausea and vomiting. Episodes have become more frequent, now occuring every 1-2 months. She feels fine in between these episodes.   Rule out biliary disease. Will obtain LFTs today. Will also obtain CBC and pancreatic enzymes. Will arrange for abdominal ultrasound.  No associated abdominal distention making partial SBO obstruction less likely (no risk factors either). Doubt bowel intussusception. Will try and arrange a "standing order" for her to have flat and upright abdominal films when having one of the episodes, which usually occur in the evening.   Will call her with test results and further recommendations.   Will give PO Toradol and Phenergan to keep on hand for these episodes. I will also give her a few Vicodin to take if needed. For precautionary measures I recommended she  discard breast milk for 24 hours if she takes the Vicodin .  2. Social anxiety / depression. Weaning off effexor, just started lexapro two days ago.   Addendum: Reviewed and agree with initial management. Jerene Bears, MD

## 2014-03-23 ENCOUNTER — Ambulatory Visit (HOSPITAL_BASED_OUTPATIENT_CLINIC_OR_DEPARTMENT_OTHER)
Admission: RE | Admit: 2014-03-23 | Discharge: 2014-03-23 | Disposition: A | Discharge status: Home / Self Care | Payer: 59 | Source: Ambulatory Visit | Attending: Nurse Practitioner | Admitting: Nurse Practitioner

## 2014-03-23 ENCOUNTER — Encounter: Payer: Self-pay | Admitting: Nurse Practitioner

## 2014-03-23 DIAGNOSIS — R109 Unspecified abdominal pain: Secondary | ICD-10-CM

## 2014-03-23 DIAGNOSIS — R1013 Epigastric pain: Secondary | ICD-10-CM | POA: Insufficient documentation

## 2014-03-23 DIAGNOSIS — R112 Nausea with vomiting, unspecified: Secondary | ICD-10-CM | POA: Insufficient documentation

## 2014-03-26 ENCOUNTER — Other Ambulatory Visit: Payer: Self-pay | Admitting: *Deleted

## 2014-03-26 DIAGNOSIS — R109 Unspecified abdominal pain: Secondary | ICD-10-CM

## 2014-04-10 ENCOUNTER — Other Ambulatory Visit: Payer: Self-pay | Admitting: *Deleted

## 2014-04-10 ENCOUNTER — Ambulatory Visit (HOSPITAL_COMMUNITY)
Admission: RE | Admit: 2014-04-10 | Discharge: 2014-04-10 | Disposition: A | Discharge status: Home / Self Care | Payer: 59 | Source: Ambulatory Visit | Attending: Nurse Practitioner | Admitting: Nurse Practitioner

## 2014-04-10 DIAGNOSIS — R109 Unspecified abdominal pain: Secondary | ICD-10-CM

## 2014-04-10 MED ORDER — FLUTICASONE PROPIONATE 50 MCG/ACT NA SUSP: 2.0000 | Freq: Every day | NASAL | Status: DC

## 2014-04-10 NOTE — Telephone Encounter (Signed)
Sent to pharmacy 

## 2014-04-11 ENCOUNTER — Telehealth: Payer: Self-pay | Admitting: Nurse Practitioner

## 2014-04-11 NOTE — Telephone Encounter (Signed)
Per Tye Savoy, NP , patient should keep appointment with Dr. Hilarie Fredrickson.

## 2014-04-11 NOTE — Telephone Encounter (Signed)
Patient notified of recommendation. 

## 2014-04-20 ENCOUNTER — Encounter: Payer: Self-pay | Admitting: Internal Medicine

## 2014-04-23 ENCOUNTER — Encounter: Payer: Self-pay | Admitting: Internal Medicine

## 2014-04-23 ENCOUNTER — Ambulatory Visit: Payer: 59 | Admitting: Internal Medicine

## 2014-04-23 ENCOUNTER — Other Ambulatory Visit (INDEPENDENT_AMBULATORY_CARE_PROVIDER_SITE_OTHER): Payer: 59

## 2014-04-23 ENCOUNTER — Ambulatory Visit (INDEPENDENT_AMBULATORY_CARE_PROVIDER_SITE_OTHER): Payer: 59 | Admitting: Internal Medicine

## 2014-04-23 VITALS — BP 100/60 | HR 72 | Ht 68.75 in | Wt 152.0 lb

## 2014-04-23 DIAGNOSIS — R7989 Other specified abnormal findings of blood chemistry: Secondary | ICD-10-CM

## 2014-04-23 DIAGNOSIS — R945 Abnormal results of liver function studies: Secondary | ICD-10-CM

## 2014-04-23 DIAGNOSIS — R1013 Epigastric pain: Secondary | ICD-10-CM

## 2014-04-23 DIAGNOSIS — R112 Nausea with vomiting, unspecified: Secondary | ICD-10-CM

## 2014-04-23 LAB — COMPREHENSIVE METABOLIC PANEL
ALBUMIN: 4.1 g/dL (ref 3.5–5.2)
ALT: 9 U/L (ref 0–35)
AST: 16 U/L (ref 0–37)
Alkaline Phosphatase: 39 U/L (ref 39–117)
BUN: 12 mg/dL (ref 6–23)
CALCIUM: 9.3 mg/dL (ref 8.4–10.5)
CHLORIDE: 103 meq/L (ref 96–112)
CO2: 30 meq/L (ref 19–32)
Creatinine, Ser: 0.9 mg/dL (ref 0.4–1.2)
GFR: 73.02 mL/min (ref >60.00)
GLUCOSE: 103 mg/dL — AB (ref 70–99)
POTASSIUM: 4.3 meq/L (ref 3.5–5.1)
SODIUM: 138 meq/L (ref 135–145)
Total Bilirubin: 0.4 mg/dL (ref 0.2–1.2)
Total Protein: 6.9 g/dL (ref 6.0–8.3)

## 2014-04-23 LAB — HEPATIC FUNCTION PANEL
ALT: 9 U/L (ref 0–35)
AST: 16 U/L (ref 0–37)
Albumin: 4.1 g/dL (ref 3.5–5.2)
Alkaline Phosphatase: 39 U/L (ref 39–117)
BILIRUBIN DIRECT: 0.1 mg/dL (ref 0.0–0.3)
Total Bilirubin: 0.4 mg/dL (ref 0.2–1.2)
Total Protein: 6.9 g/dL (ref 6.0–8.3)

## 2014-04-23 NOTE — Progress Notes (Signed)
Subjective:    [name], female    DOB: Mar 19, 1976, 38 y.o.   MRN: [id number]  HPI [name] is a 38 yo female with little past medical history other than seasonal allergies and mild asthma who is seen in office followup. She was initially seen by [name] to evaluate episodic epigastric abdominal pain associated with nausea and vomiting. She is here today with her husband and youngest child.  We discussed her symptoms, which include sporadic/episodic attacks of epigastric abdominal pain which is crescendo decrescendo type pain building in the epigastrium often leading to nausea and vomiting. The pain can be very severe and limiting her activity. She estimates that these attacks occur infrequently and have been happening over the last 10-12 years. She has no history of previous abdominal surgery. No weight loss. No change in appetite. She reports she is completely normal between episodes. Episodes last about 4 hours, but she reports she is often sore the next day. Around May 7 she had several of these attacks back to back and came for abdominal ultrasound and lab testing. In the past she had used IM Toradol and IM Phenergan to help abate an attack. No attack in the last month.  She denies family history of IBD or celiac disease. No change in bowel habits. No diarrhea, constipation.  Attacks are not associated with fever or chills. She is currently breast-feeding her youngest child and HIDA scan which was ordered was canceled due the risk of radioactive tracer in breast milk.  Review of Systems As per history of present illness, otherwise negative  Current Medications, Allergies, Past Medical History, Past Surgical History, Family History and Social History were reviewed in [employer] record.     Objective:   Physical Exam BP 100/60  Pulse 72  Ht 5' 8.75" (1.746 m)  Wt 152 lb (68.947 kg)  BMI 22.62 kg/m2  LMP 04/15/2014 Constitutional:  Well-developed and well-nourished. No distress. HEENT: Normocephalic and atraumatic.   No scleral icterus. Cardiovascular: Normal rate, regular rhythm and intact distal pulses. No M/R/G Pulmonary/chest: Effort normal and breath sounds normal. No wheezing, rales or rhonchi. Abdominal: Soft, nontender, nondistended. Bowel sounds active throughout. There are no masses palpable. No hepatosplenomegaly. Extremities: no clubbing, cyanosis, or edema Neurological: Alert and oriented to person place and time. Skin: Skin is warm and dry. No rashes noted. Psychiatric: Normal mood and affect. Behavior is normal.  Results for [name] (MRN [id number]) as of 04/23/2014 17:44  Ref. Range 03/14/2013 12:07 03/22/2014 15:58 04/23/2014 15:56  Alkaline Phosphatase Latest Range: 39-117 U/L 36 (L) 191 (H) 39  Albumin Latest Range: 3.5-5.2 g/dL 3.9 3.7 4.1  Amylase Latest Range: 27-131 U/L  41   Lipase Latest Range: 11.0-59.0 U/L  23.0   AST Latest Range: 0-37 U/L 15 30 16   ALT Latest Range: 0-35 U/L 9 48 (H) 9  Total Protein Latest Range: 6.0-8.3 g/dL 6.3 7.1 6.9  Bilirubin, Direct Latest Range: 0.0-0.3 mg/dL 0.1 0.1 0.1  Total Bilirubin Latest Range: 0.2-1.2 mg/dL 0.7 0.6 0.4    ULTRASOUND ABDOMEN COMPLETE -- May 2015   COMPARISON:  None.   FINDINGS: Gallbladder:   No gallstones or wall thickening visualized. No sonographic Murphy sign noted.   Common bile duct:   Diameter: 3.0 mm, normal.   Liver:   No focal lesion identified. Within normal limits in parenchymal echogenicity.   IVC:   No abnormality visualized.   Pancreas:   Normal.   Spleen:  Slightly enlarged measuring 14.2 cm in length.  Accessory splenule.   Right Kidney:   Length: 11.1 cm. Echogenicity within normal limits. No mass or hydronephrosis visualized.   Left Kidney:   Length: 10.6 cm. Echogenicity within normal limits. No mass or hydronephrosis visualized.   Abdominal aorta:   Normal.  2.0 cm maximum  diameter.   Other findings:   None.   IMPRESSION: The spleen appears slightly prominent but there are no focal lesions. The study is otherwise normal.         Assessment & Plan:  38 yo female with little past medical history other than seasonal allergies and mild asthma who is seen in office followup to evaluate episodic epigastric pain with nausea vomiting and elevated liver enzymes  1.  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 Also in the differential would be biliary dyskinesia. Her ultrasound did not show any biliary ductal dilatation or gallstones. Lipase was normal and she has no history of pancreatitis. We discussed biliary dyskinesia and sphincter of oddi dysfunction at length today. Interestingly SOD is most associated with the postcholecystectomy state, but this is not an absolute requirement. I have reviewed her liver enzymes and her AST and ALT were quite low along with her alkaline phosphatase one year ago, but alkaline phosphatase became significantly more elevated as did AST and ALT during her recent attack. Enzymes were rechecked today a normal state and may have returned completely to normal.  We discussed further testing with [facility] HIDA, but she would like to defer this given that she is breast-feeding. When she finishes breast-feeding we will likely proceed with this test.  We also discussed ERCP, sphincterotomy, and biliary manometry, but given she has not had any recent attacks we will not pursue this further at this time. I have recommended that she have a repeat hepatic function panel after any subsequent attacks. We will write a standing order for this blood test. She can use as needed Toradol, promethazine, and/or Vicodin to help abate a future attack should one occur.  She is happy with this plan for now, and  asked that she call me with any future episodes.

## 2014-04-23 NOTE — Patient Instructions (Addendum)
Your physician has requested that you go to the basement for the following lab work before leaving today: Heptaic function panel, CMP  After an episodic abdominal attack, please come in for a hepatic function panel  Continue meds

## 2014-04-24 ENCOUNTER — Other Ambulatory Visit: Payer: Self-pay | Admitting: *Deleted

## 2014-04-24 ENCOUNTER — Other Ambulatory Visit: Payer: Self-pay

## 2014-04-24 ENCOUNTER — Other Ambulatory Visit: Payer: Self-pay | Admitting: Nurse Practitioner

## 2014-04-24 DIAGNOSIS — F4010 Social phobia, unspecified: Secondary | ICD-10-CM

## 2014-04-24 DIAGNOSIS — R740 Nonspecific elevation of levels of transaminase and lactic acid dehydrogenase [LDH]: Secondary | ICD-10-CM

## 2014-04-24 DIAGNOSIS — R7401 Elevation of levels of liver transaminase levels: Secondary | ICD-10-CM

## 2014-04-24 DIAGNOSIS — R109 Unspecified abdominal pain: Secondary | ICD-10-CM

## 2014-04-24 MED ORDER — ESCITALOPRAM OXALATE 20 MG PO TABS: 20.0000 mg | ORAL_TABLET | Freq: Every day | ORAL | Status: DC

## 2014-04-24 NOTE — Telephone Encounter (Signed)
Pt is requesting refill for Lexapro 20 mg be sent to [facility]. Pt stated that it takes a few days for meds from this home delivery pharmacy to ship rx's.

## 2014-05-30 ENCOUNTER — Telehealth: Payer: Self-pay | Admitting: Family Medicine

## 2014-05-30 MED ORDER — FLUTICASONE PROPIONATE 50 MCG/ACT NA SUSP: 2.0000 | Freq: Every day | NASAL | Status: DC

## 2014-05-30 NOTE — Telephone Encounter (Signed)
Refill-fluticasone  [facility]

## 2014-05-30 NOTE — Telephone Encounter (Signed)
Refill sent to pharmacy.   

## 2014-06-20 ENCOUNTER — Ambulatory Visit (INDEPENDENT_AMBULATORY_CARE_PROVIDER_SITE_OTHER): Payer: 59 | Admitting: Nurse Practitioner

## 2014-06-20 ENCOUNTER — Encounter: Payer: Self-pay | Admitting: Nurse Practitioner

## 2014-06-20 ENCOUNTER — Other Ambulatory Visit: Payer: Self-pay | Admitting: *Deleted

## 2014-06-20 VITALS — BP 115/80 | HR 95 | Temp 98.3°F | Ht 69.0 in | Wt 152.0 lb

## 2014-06-20 DIAGNOSIS — F4010 Social phobia, unspecified: Secondary | ICD-10-CM

## 2014-06-20 DIAGNOSIS — F409 Phobic anxiety disorder, unspecified: Secondary | ICD-10-CM

## 2014-06-20 MED ORDER — FLUOXETINE HCL 20 MG PO TABS: 20.0000 mg | ORAL_TABLET | Freq: Every day | ORAL | Status: DC

## 2014-06-20 MED ORDER — PAROXETINE HCL 20 MG PO TABS: 20.0000 mg | ORAL_TABLET | Freq: Every day | ORAL | Status: DC

## 2014-06-20 MED ORDER — LORAZEPAM 1 MG PO TABS: 1.0000 mg | ORAL_TABLET | Freq: Two times a day (BID) | ORAL | Status: DC | PRN

## 2014-06-20 NOTE — Telephone Encounter (Signed)
Resent rx to mail order pharmacy.

## 2014-06-20 NOTE — Assessment & Plan Note (Signed)
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 Ativan is somewhat helpful at .5 mg. Will increase to 1 mg. Stop lexapro. Start paxil. Refer for CBT. F/u 6 to 8 weeks.

## 2014-06-20 NOTE — Patient Instructions (Addendum)
Make appointment with therapist for cognitive behavioral therapy. Stop lexapro. Start paxil. Continue to use ativan 1 mg as needed.  I will see you 6 to 8 weeks.

## 2014-06-20 NOTE — Progress Notes (Signed)
Pre visit review using our clinic review tool, if applicable. No additional management support is needed unless otherwise documented below in the visit note. 

## 2014-06-21 NOTE — Progress Notes (Signed)
Subjective:     [name] is a 38 y.o. female who presents for follow up of anxiety disorder-anxiety r/t highway driving and some social situations. Current symptoms: difficulty concentrating, dizziness. She denies current suicidal and homicidal ideation. Current treatment is Lexapro 20 mg qd & 0.5 mg ativan SL PRN. She does not feel lexapro is helping. She uses ativan when driving to church on Sundays, but feels she needs higher dose. In the past she has used effexor-worked for awhile, then seemed to be less effective. She recalls having used paxil in past, but only took it for 1 month due to pregnancy-stopped medication.  The following portions of the patient's history were reviewed and updated as appropriate: allergies, current medications, past medical history, past social history, past surgical history and problem list.    Objective:    BP 115/80  Pulse 95  Temp(Src) 98.3 F (36.8 C) (Temporal)  Ht 5\' 9"  (1.753 m)  Wt 152 lb (68.947 kg)  BMI 22.44 kg/m2  SpO2 97%  General:  alert, cooperative, appears stated age and no distress  Affect/Behavior:  full facial expressions, good grooming, good insight, normal perception, normal reasoning, normal speech pattern and content and normal thought patterns .      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  Dispense: 90 tablet; Refill: 1 - LORazepam (ATIVAN) 1 MG tablet; Place 1 tablet (1 mg total) under the tongue 2 (two) times daily as needed for anxiety.  Dispense: 45 tablet; Refill: 1  See problem list for complete A&P See pt instructions. F/u 6-8 weeks.

## 2014-09-17 ENCOUNTER — Encounter: Payer: Self-pay | Admitting: Nurse Practitioner

## 2014-09-18 ENCOUNTER — Other Ambulatory Visit: Payer: Self-pay | Admitting: *Deleted

## 2014-09-18 MED ORDER — FLUTICASONE PROPIONATE 50 MCG/ACT NA SUSP: 1.0000 | Freq: Two times a day (BID) | NASAL | Status: DC

## 2014-09-18 NOTE — Telephone Encounter (Signed)
Pharmacy sent refill request for Flonase. Flonase has never been filled by pcp. Please advise?

## 2014-09-20 ENCOUNTER — Other Ambulatory Visit: Payer: Self-pay

## 2014-10-02 ENCOUNTER — Ambulatory Visit: Payer: 59

## 2014-10-02 ENCOUNTER — Ambulatory Visit (INDEPENDENT_AMBULATORY_CARE_PROVIDER_SITE_OTHER): Payer: 59 | Admitting: Family Medicine

## 2014-10-02 DIAGNOSIS — Z23 Encounter for immunization: Secondary | ICD-10-CM

## 2014-12-14 ENCOUNTER — Encounter: Payer: Self-pay | Admitting: Nurse Practitioner

## 2014-12-14 ENCOUNTER — Ambulatory Visit (INDEPENDENT_AMBULATORY_CARE_PROVIDER_SITE_OTHER): Payer: 59 | Admitting: Nurse Practitioner

## 2014-12-14 VITALS — BP 122/80 | HR 102 | Temp 98.7°F | Ht 69.0 in | Wt 151.0 lb

## 2014-12-14 DIAGNOSIS — F409 Phobic anxiety disorder, unspecified: Secondary | ICD-10-CM

## 2014-12-14 DIAGNOSIS — Z1321 Encounter for screening for nutritional disorder: Secondary | ICD-10-CM

## 2014-12-14 DIAGNOSIS — Z79899 Other long term (current) drug therapy: Secondary | ICD-10-CM

## 2014-12-14 DIAGNOSIS — F4010 Social phobia, unspecified: Secondary | ICD-10-CM

## 2014-12-14 LAB — CBC WITH DIFFERENTIAL/PLATELET
Basophils Absolute: 0 10*3/uL (ref 0.0–0.1)
Basophils Relative: 0.4 % (ref 0.0–3.0)
EOS ABS: 0 10*3/uL (ref 0.0–0.7)
Eosinophils Relative: 0 % (ref 0.0–5.0)
HCT: 42.4 % (ref 36.0–46.0)
HEMOGLOBIN: 14.8 g/dL (ref 12.0–15.0)
LYMPHS PCT: 22.3 % (ref 12.0–46.0)
Lymphs Abs: 1.2 10*3/uL (ref 0.7–4.0)
MCHC: 34.9 g/dL (ref 30.0–36.0)
MCV: 85.7 fl (ref 78.0–100.0)
MONO ABS: 0.3 10*3/uL (ref 0.1–1.0)
Monocytes Relative: 4.8 % (ref 3.0–12.0)
NEUTROS PCT: 72.5 % (ref 43.0–77.0)
Neutro Abs: 3.8 10*3/uL (ref 1.4–7.7)
Platelets: 213 10*3/uL (ref 150.0–400.0)
RBC: 4.95 Mil/uL (ref 3.87–5.11)
RDW: 13.3 % (ref 11.5–15.5)
WBC: 5.2 10*3/uL (ref 4.0–10.5)

## 2014-12-14 LAB — COMPREHENSIVE METABOLIC PANEL
ALBUMIN: 4.8 g/dL (ref 3.5–5.2)
ALT: 8 U/L (ref 0–35)
AST: 17 U/L (ref 0–37)
Alkaline Phosphatase: 39 U/L (ref 39–117)
BILIRUBIN TOTAL: 0.5 mg/dL (ref 0.2–1.2)
BUN: 13 mg/dL (ref 6–23)
CO2: 28 meq/L (ref 19–32)
Calcium: 9.8 mg/dL (ref 8.4–10.5)
Chloride: 102 mEq/L (ref 96–112)
Creatinine, Ser: 0.93 mg/dL (ref 0.40–1.20)
GFR: 70.98 mL/min (ref >60.00)
GLUCOSE: 82 mg/dL (ref 70–99)
POTASSIUM: 4 meq/L (ref 3.5–5.1)
SODIUM: 137 meq/L (ref 135–145)
TOTAL PROTEIN: 7.3 g/dL (ref 6.0–8.3)

## 2014-12-14 LAB — T4, FREE: Free T4: 0.83 ng/dL (ref 0.60–1.60)

## 2014-12-14 LAB — VITAMIN D 25 HYDROXY (VIT D DEFICIENCY, FRACTURES): VITD: 22.41 ng/mL — AB (ref 30.00–100.00)

## 2014-12-14 LAB — TSH: TSH: 0.84 u[IU]/mL (ref 0.35–4.50)

## 2014-12-14 LAB — VITAMIN B12: VITAMIN B 12: 586 pg/mL (ref 211–911)

## 2014-12-14 MED ORDER — PAROXETINE HCL 30 MG PO TABS: 30.0000 mg | ORAL_TABLET | Freq: Every day | ORAL | Status: DC

## 2014-12-14 NOTE — Patient Instructions (Signed)
Start new dose paxil.  Continue ativan as needed.  My office will call with lab results.  Next Pap is due in 2017.  Let me know if you are not feeling better. Great to see you!

## 2014-12-14 NOTE — Progress Notes (Signed)
Pre visit review using our clinic review tool, if applicable. No additional management support is needed unless otherwise documented below in the visit note. 

## 2014-12-14 NOTE — Progress Notes (Signed)
Subjective:     [name] is a 39 y.o. female presents for f/u of social phobia disorder & depression.  I saw her about 6 mos ago. At that time she was feeling like anxiety was worse-she was avoiding car trips. I increased her ativan to 1 mg BID PRN from 0.5mg  as she states she couldn't tell 0.5 mg helped with situational anxiety. I also stopped lexapro & started paxil 20 mg, and referred her to therapist. [facility] saw therapist 1 time, states it was helpful but stressful to get to appt with family responsibilities. She is reading book that has helpful strategies "Help for Nerves" Glorious Peach. She cannot tell that paxil is more helpful than lexapro. She cuts ativan to take 3/4 Tab as whole tab makes her feel too sedated. She wonders if another medication or higher dose paxil would be helpful.   I reviewed labs & prev screenings: due for labs. Will add TSH & B12 screen.  Pap due in 2017.  The following portions of the patient's history were reviewed and updated as appropriate: allergies, current medications, past medical history, past social history, past surgical history and problem list.  Review of Systems Constitutional: negative for weight loss Respiratory: positive for occasional wheeze in cold air & viral illness. rarely uses inhaler. Cardiovascular: negative for palpitations Genitourinary:negative for abnormal menstrual periods, . Behavioral/Psych: negative for loss of interest in favorite activities, mood swings and sleep disturbance, feels over stimulated at times    Objective:    BP 122/80 mmHg  Pulse 102  Temp(Src) 98.7 F (37.1 C) (Oral)  Ht 5\' 9"  (1.753 m)  Wt 151 lb (68.493 kg)  BMI 22.29 kg/m2  SpO2 98% BP 122/80 mmHg  Pulse 102  Temp(Src) 98.7 F (37.1 C) (Oral)  Ht 5\' 9"  (1.753 m)  Wt 151 lb (68.493 kg)  BMI 22.29 kg/m2  SpO2 98% General appearance: alert, cooperative, appears stated age and no distress Head: Normocephalic, without obvious abnormality,  atraumatic Eyes: negative findings: lids and lashes normal and conjunctivae and sclerae normal Neck: no carotid bruit, supple, symmetrical, trachea midline and thyroid not enlarged, symmetric, no tenderness/mass/nodules Lungs: wheezes LLL Heart: regular rate and rhythm, S1, S2 normal, no murmur, click, rub or gallop    Assessment:Plan  1. Encounter for long-term current use of medication paxil & ativan - Comprehensive metabolic panel - CBC with Differential/Platelet  2. Encounter for vitamin deficiency screening - Vit D  25 hydroxy (rtn osteoporosis monitoring)  3. Phobic anxiety disorder Increase dose Screen for B12 deficicency & thyroid disease - PARoxetine (PAXIL) 30 MG tablet; Take 1 tablet (30 mg total) by mouth daily.  Dispense: 90 tablet; Refill: 2 - T4, free - TSH - Thyroid peroxidase antibody - Vitamin B12  See pt instructions. F/u 6-8 weeks Pap due next yr

## 2014-12-15 ENCOUNTER — Telehealth: Payer: Self-pay | Admitting: Nurse Practitioner

## 2014-12-15 DIAGNOSIS — E559 Vitamin D deficiency, unspecified: Secondary | ICD-10-CM

## 2014-12-15 LAB — THYROID PEROXIDASE ANTIBODY: Thyroperoxidase Ab SerPl-aCnc: 1 IU/mL (ref <9)

## 2014-12-15 NOTE — Telephone Encounter (Signed)
Labs nml except vit D low: start 5000 iu qd with meal for 12 weeks, then check level again.

## 2014-12-17 NOTE — Telephone Encounter (Signed)
Patient notified of results. Patient expressed understanding.  

## 2014-12-17 NOTE — Telephone Encounter (Signed)
pls call pt: Advise Labs look good, but Vit D is too low: she needs to start D3 5000 iu daily with meal. We will check level in 12 weeks.

## 2015-01-24 ENCOUNTER — Other Ambulatory Visit: Payer: Self-pay

## 2015-01-24 DIAGNOSIS — F4010 Social phobia, unspecified: Secondary | ICD-10-CM

## 2015-01-24 MED ORDER — LORAZEPAM 1 MG PO TABS: 1.0000 mg | ORAL_TABLET | Freq: Two times a day (BID) | ORAL | Status: DC | PRN

## 2015-01-24 NOTE — Telephone Encounter (Signed)
Please Advise Refill Request? Refill request for- Ativan Tab 1mg  Last filled by MD on - 08/15 Last Appt - 12/14/14         Next Appt - none scheduled

## 2015-03-13 ENCOUNTER — Telehealth: Payer: Self-pay

## 2015-03-13 NOTE — Telephone Encounter (Signed)
OK to increase paxil to 40 mg. I will need to see her in ofc in 4 to 6 weeks. Does she have 20 mg capsules? If so she can take 2 caps qd. If not, I will prescribe 40 mg caps.

## 2015-03-13 NOTE — Telephone Encounter (Signed)
Please Advise: Patient is currently Paxil 30 mg, stated that she had discussed with you about moving it up to 40 mg. She is wondering if she can go ahead and do this or if she needs an OV first? Patient states that she has enough meds

## 2015-03-13 NOTE — Telephone Encounter (Signed)
Done

## 2015-03-13 NOTE — Telephone Encounter (Signed)
pls edit med list to reflect prozac 40 mg qd.

## 2015-03-13 NOTE — Addendum Note (Signed)
Addended by: Audley Hose on: 03/13/2015 02:30 PM   Modules accepted: Medications

## 2015-03-13 NOTE — Telephone Encounter (Signed)
Called and informed patient. She does have 20 mg capsules.

## 2015-04-29 ENCOUNTER — Telehealth: Payer: Self-pay | Admitting: Nurse Practitioner

## 2015-04-29 DIAGNOSIS — H6691 Otitis media, unspecified, right ear: Secondary | ICD-10-CM

## 2015-04-29 MED ORDER — AMOXICILLIN 500 MG PO CAPS
500.0000 mg | ORAL_CAPSULE | Freq: Two times a day (BID) | ORAL | Status: DC
Start: 2015-04-29 — End: 2015-04-29

## 2015-04-29 MED ORDER — AMOXICILLIN 500 MG PO CAPS: 500.0000 mg | ORAL_CAPSULE | Freq: Two times a day (BID) | ORAL | Status: DC

## 2015-04-29 NOTE — Telephone Encounter (Signed)
Pt advises ear is painful for several days, has had nasal congestion, has Hx eustacian tube dysfunction. Will send 10 days amoxicllin Will call if worse or not getting better

## 2015-05-23 ENCOUNTER — Ambulatory Visit: Payer: 59 | Admitting: Nurse Practitioner

## 2015-05-28 ENCOUNTER — Telehealth: Payer: Self-pay | Admitting: Nurse Practitioner

## 2015-05-28 ENCOUNTER — Encounter: Payer: Self-pay | Admitting: Nurse Practitioner

## 2015-05-28 ENCOUNTER — Ambulatory Visit (INDEPENDENT_AMBULATORY_CARE_PROVIDER_SITE_OTHER): Payer: 59 | Admitting: Nurse Practitioner

## 2015-05-28 VITALS — BP 109/78 | HR 88 | Temp 97.5°F | Resp 16 | Ht 69.0 in | Wt 150.0 lb

## 2015-05-28 DIAGNOSIS — E559 Vitamin D deficiency, unspecified: Secondary | ICD-10-CM

## 2015-05-28 DIAGNOSIS — F411 Generalized anxiety disorder: Secondary | ICD-10-CM

## 2015-05-28 LAB — VITAMIN D 25 HYDROXY (VIT D DEFICIENCY, FRACTURES): VITD: 24.82 ng/mL — AB (ref 30.00–100.00)

## 2015-05-28 MED ORDER — BUPROPION HCL 75 MG PO TABS: ORAL_TABLET | ORAL | Status: DC

## 2015-05-28 MED ORDER — VITAMIN D3 1.25 MG (50000 UT) PO CAPS: 1.0000 | ORAL_CAPSULE | ORAL | Status: DC

## 2015-05-28 NOTE — Progress Notes (Signed)
Subjective:     [name] is a 39 y.o. female presents for follow up of social anxiety & vitamin d deficiency. Anxiety: currently taking paxil 40 mg qd. She isn't sure if this works better than 20 mg, but in reflection she hasn't had any panic attacks since she increased dose to 40 mg about 2 mos ago. She is still feeling anxious when she has to go out of house, but is usually able to push through & once she gets to destination, she feels better. She is sleeping OK. Recently cut back caffeine because she was having hard time falling asleep. She occasionally takes 5 mg melatonin with onset of sleep within 20 minutes. She occasionally feels tired during day. She started B complex, fish oil and lavendar capsules about 3 weeks ago. She isn't sure if these are helping. I suggested adding magnesium maleate for its calming effect & help with sleep. I discussed adding wellbutrin to paxil and discussed potential SE. 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 She understands medications will not eliminate anxiety & nervousness. 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 She reports family Hx of anxiety-mother & nephew. Vit d def: She completed a "bottle" of D3 5000 iu, no intol SE. We will check level today.   The following portions of the patient's history were reviewed and updated as appropriate: allergies, current medications, past family history, past medical history, past social history, past surgical history and problem list.  Review of Systems Pertinent items are noted in HPI.    Objective:    BP 109/78 mmHg  Pulse 103  Temp(Src) 97.5 F (36.4 C) (Temporal)  Resp 16  Ht 5\' 9"  (1.753 m)  Wt 150 lb (68.04 kg)  BMI 22.14 kg/m2  SpO2 96%  LMP 05/17/2015 (Approximate) General appearance: alert, cooperative, appears stated age and no  distress Eyes: negative findings: lids and lashes normal and conjunctivae and sclerae normal Lungs: clear to auscultation bilaterally Heart: regular rate and rhythm, S1, S2 normal, no murmur, click, rub or gallop Neurologic: Grossly normal    Assessment:Plan   1. Vitamin D deficiency Check level today - Vit D  25 hydroxy (rtn osteoporosis monitoring)  2. Generalized anxiety disorder start - buPROPion (WELLBUTRIN) 75 MG tablet; Take 1T po qd X 7d, then increase to 2T PO qd  Dispense: 60 tablet; Refill: 0 Continue  paxil 40 mg qd Ativan 1 mg BID PRN  F/u 4 weeks-prescribe wellbutrin 150 mg tab qd if feeling better.

## 2015-05-28 NOTE — Patient Instructions (Signed)
Start wellbutrin. Take as directed.If you feel better on 150 mg, prescription will be changed to 150 mg tab when it is refilled. It will take at least 4 weeks to know if this medicine will work. Side effects often go away after 2 weeks. It may take 6 weeks to reach maximum efficacy.  You may add 250 mg magnesium maleate. If this causes diarrhea, decrease dose to 125 mg. Consider taking this in the evening.  It has been a pleasure to partner with you in your healthcare!

## 2015-05-28 NOTE — Progress Notes (Signed)
Pre visit review using our clinic review tool, if applicable. No additional management support is needed unless otherwise documented below in the visit note. 

## 2015-05-28 NOTE — Telephone Encounter (Signed)
pls call pt: Advise Vit D still low-24, needs to be 50. I sent prescription strength. Take 1 capsule weekly for 12 weeks. Plan to have level checked again in 12 weeks.

## 2015-05-29 NOTE — Telephone Encounter (Signed)
Pt's husband advised.   Okay per DPR.

## 2015-05-31 ENCOUNTER — Other Ambulatory Visit: Payer: Self-pay | Admitting: Family Medicine

## 2015-05-31 DIAGNOSIS — E559 Vitamin D deficiency, unspecified: Secondary | ICD-10-CM

## 2015-05-31 MED ORDER — VITAMIN D3 1.25 MG (50000 UT) PO CAPS: 1.0000 | ORAL_CAPSULE | ORAL | Status: DC

## 2015-05-31 NOTE — Telephone Encounter (Signed)
[facility] was unable to fill Rx as prescribed.  Pt okay with sending Vit D Rx to [place].   Rx sent.

## 2015-06-07 ENCOUNTER — Telehealth: Payer: Self-pay | Admitting: Family Medicine

## 2015-06-07 NOTE — Telephone Encounter (Signed)
Pt LMOM stating that she would like generic paxil 20 mg sent into [facility] as 90 day supply.  Please advise.

## 2015-06-10 ENCOUNTER — Other Ambulatory Visit: Payer: Self-pay | Admitting: Nurse Practitioner

## 2015-06-10 DIAGNOSIS — F409 Phobic anxiety disorder, unspecified: Secondary | ICD-10-CM

## 2015-06-10 MED ORDER — PAROXETINE HCL 10 MG PO TABS: 10.0000 mg | ORAL_TABLET | Freq: Every day | ORAL | Status: DC

## 2015-06-10 NOTE — Telephone Encounter (Signed)
Spoke to pt and she states that she is taking 40mg  daily but [facility] sent her 30mg  tablets so she is taking one of those along with half of a 20mg  tablet that was left over from previous Rx. She stated that she has about 90 of the 30mg  tablets left and would like to use them up so she is request we send in #90 w/ 0Rf of the 20mg  to that she can use up the 30mg  tablets. Please advise. Thanks.

## 2015-06-10 NOTE — Telephone Encounter (Signed)
She is taking 40 mg qd. Does she prefer 20 mg 2 Tab qd?

## 2015-06-11 NOTE — Telephone Encounter (Signed)
-----   Message from Irene Pap, NP sent at 06/10/2015 5:14 PM EDT -----    I sent 10 mg tabs. She is to take whole tablet with the 30 mg tablet to get 40 mg qd.

## 2015-07-05 ENCOUNTER — Other Ambulatory Visit: Payer: Self-pay | Admitting: Family Medicine

## 2015-07-05 DIAGNOSIS — F411 Generalized anxiety disorder: Secondary | ICD-10-CM

## 2015-07-05 MED ORDER — BUPROPION HCL 75 MG PO TABS: 75.0000 mg | ORAL_TABLET | Freq: Two times a day (BID) | ORAL | Status: DC

## 2015-08-28 ENCOUNTER — Ambulatory Visit (INDEPENDENT_AMBULATORY_CARE_PROVIDER_SITE_OTHER): Payer: 59 | Admitting: Family Medicine

## 2015-08-28 ENCOUNTER — Encounter: Payer: Self-pay | Admitting: Family Medicine

## 2015-08-28 ENCOUNTER — Other Ambulatory Visit: Payer: Self-pay | Admitting: Family Medicine

## 2015-08-28 VITALS — BP 119/86 | HR 111 | Temp 98.0°F | Resp 18 | Ht 69.0 in | Wt 151.0 lb

## 2015-08-28 DIAGNOSIS — E559 Vitamin D deficiency, unspecified: Secondary | ICD-10-CM | POA: Diagnosis not present

## 2015-08-28 DIAGNOSIS — F411 Generalized anxiety disorder: Secondary | ICD-10-CM | POA: Diagnosis not present

## 2015-08-28 DIAGNOSIS — Z23 Encounter for immunization: Secondary | ICD-10-CM

## 2015-08-28 DIAGNOSIS — F409 Phobic anxiety disorder, unspecified: Secondary | ICD-10-CM | POA: Diagnosis not present

## 2015-08-28 DIAGNOSIS — Z299 Encounter for prophylactic measures, unspecified: Secondary | ICD-10-CM

## 2015-08-28 DIAGNOSIS — Z0000 Encounter for general adult medical examination without abnormal findings: Secondary | ICD-10-CM

## 2015-08-28 DIAGNOSIS — Z01419 Encounter for gynecological examination (general) (routine) without abnormal findings: Secondary | ICD-10-CM | POA: Insufficient documentation

## 2015-08-28 MED ORDER — BUPROPION HCL 100 MG PO TABS: 150.0000 mg | ORAL_TABLET | Freq: Once | ORAL | Status: DC

## 2015-08-28 MED ORDER — PAROXETINE HCL 30 MG PO TABS: 30.0000 mg | ORAL_TABLET | Freq: Every day | ORAL | Status: DC

## 2015-08-28 MED ORDER — BUPROPION HCL 100 MG PO TABS: 150.0000 mg | ORAL_TABLET | Freq: Every day | ORAL | Status: DC

## 2015-08-28 MED ORDER — BUPROPION HCL 100 MG PO TABS: 150.0000 mg | ORAL_TABLET | Freq: Two times a day (BID) | ORAL | Status: DC

## 2015-08-28 NOTE — Progress Notes (Signed)
Subjective:    [name], female    DOB: Mar 13, 1976, 39 y.o.   MRN: [id number]  HPI  Vitamin D deficiency: Patient's last vitamin D, July 2016, indicated vitamin D deficiency with a level of 24. She had a low vitamin D in January 2016 with a level of 22. She states that she was supplementing her vitamin D, but discontinued after a bottle ran out and just forgot to restart, when rechecked it was low again and 50,000 units weekly was started. Patient has been supplementing with 50,000 units every 7 days of vitamin D. She states she took her last pill a few weeks ago.  Generalized anxiety disorder: Patient has a history of generalized anxiety disorder, and phobic disorder. She has been on Wellbutrin and Paxil continuously for the last 3 months. She states that her Paxil dose has increased over the last 6 months, and she is currently taking 30 mg of Paxil daily and feels stable on this medication. Her Wellbutrin and she is taking 150 mg at night, she states that she feels this was a good addition to her medication regimen. Patient also has been prescribed 1 mg of Ativan 2 times a day as needed, patient states that she usually takes this about once a week only, and has plenty of medication left. 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 Patient is in need of refills for her Wellbutrin and her Paxil today.  Health maintenance:  Colonoscopy: [place] after 40 yo, routine screening to start at age 64 Mammogram: No family history of breast cancer, although there is family history of uterine cancer. Mammogram screening to start at age 96. Cervical cancer screening: Up-to-date, next Pap 2017 Immunizations: Flu shot indicated, tetanus up-to-date  Indicated 2021 (pt states given 2011) Infectious disease screening: HIV completed, negative  Past Medical History  Diagnosis Date  . Allergy     seasonal  . ANXIETY DEPRESSION 11/21/2010  .  ALLERGIC RHINITIS 11/21/2010  . Other specified anemias 11/21/2010  . Asthma   . Mastitis 06/14/2012  . Dehydration 06/14/2012  . Unspecified asthma(493.90) 03/18/2013   No Known Allergies History reviewed. No pertinent past surgical history. Family History  Problem Relation Age of Onset  . Hyperlipidemia Mother   . Osteoarthritis Mother   . Diabetes Father     Type 2  . Ulcers Father   . Migraines Sister   . Bipolar disorder Brother   . Panic disorder Brother   . Arthritis Maternal Grandmother   . Cancer Maternal Grandmother     uterine  . Cancer Maternal Grandfather 46    colon  . Heart disease Maternal Grandfather   . Heart attack Maternal Grandfather   . Diabetes Paternal Grandmother   . Hypertension Paternal Grandmother   . Cancer Paternal Grandfather     multiple myeloma  . Anesthesia problems Neg Hx   . Hypotension Neg Hx   . Malignant hyperthermia Neg Hx   . Pseudochol deficiency Neg Hx    Social History   Social History  . Marital Status: Married    Spouse Name: N/A  . Number of Children: N/A  . Years of Education: N/A   Occupational History  . Not on file.   Social History Main Topics  . Smoking status: Never Smoker   . Smokeless tobacco: Never Used  . Alcohol Use: 0.0 oz/week    0 drink(s) per week     Comment: rare occasional  glass of wine  . Drug Use: No  . Sexual Activity: Yes   Other Topics Concern  . Not on file   Social History Narrative     Review of Systems Negative, with the exception of above mentioned in HPI     Objective:   Physical Exam BP 119/86 mmHg  Pulse 111  Temp(Src) 98 F (36.7 C) (Oral)  Resp 18  Ht 5' 9"  (1.753 m)  Wt 151 lb (68.493 kg)  BMI 22.29 kg/m2  SpO2 96% Gen: Afebrile. No acute distress. Nontoxic in appearance, well-developed, well-nourished, Caucasian female. Cooperative with exam, very pleasant. Eyes:Pupils Equal Round Reactive to light, Extraocular movements intact,  Conjunctiva without redness,  discharge or icterus. CV: RRR no murmur appreciated Chest: CTAB, no wheeze or crackles Psych: Normal affect, dress and demeanor. Normal speech. Normal thought content and judgment..      Assessment & Plan:  1. Need for prophylactic vaccination and inoculation against influenza - Flu Vaccine QUAD 36+ mos IM  2. Phobic anxiety disorder/generalized anxiety disorder: - Stable. Continue current regimen of Paxil 30 mg and Wellbutrin 150 mg. - PARoxetine (PAXIL) 30 MG tablet; Take 1 tablet (30 mg total) by mouth daily.  Dispense: 90 tablet; Refill: 1 - buPROPion (WELLBUTRIN) 100 MG tablet; Take 1.5 tablets (150 mg total) by QD.  Dispense: 135 tablet; Refill: 1  4. Vitamin D deficiency - Vitamin D (25 hydroxy) collected today - Once level received, will call patient and discuss to continue current dose, or use over-the-counter 600 units daily.  5. Health care maintenance Colonoscopy: [place] after 40 yo, routine screening to start at age 86 Mammogram: No family history of breast cancer, although there is family history of uterine cancer. Mammogram screening to start at age 60. Cervical cancer screening: Up-to-date, next Pap 2017 Immunizations: Flu shot administered today, tetanus up-to-date  Indicated 2021 (pt states given 2011) Infectious disease screening: HIV completed, negative  6. Preventive measure - Future labs placed for completed prior to physical which should be scheduled for March/April 2017 - CBC w/Diff; Future - Comprehensive metabolic panel; Future - Lipid panel; Future  Patient schedule complete physical with Pap, labs to be drawn prior in 6 months.

## 2015-08-28 NOTE — Progress Notes (Signed)
Pre visit review using our clinic review tool, if applicable. No additional management support is needed unless otherwise documented below in the visit note. 

## 2015-08-28 NOTE — Patient Instructions (Signed)
It was a pleasure seeing you today. I have called in refills for your medications. I will call you once we I get the result to your vitamin D and discuss need for supplement and dosage.

## 2015-08-29 LAB — VITAMIN D 25 HYDROXY (VIT D DEFICIENCY, FRACTURES): VITD: 54.35 ng/mL (ref 30.00–100.00)

## 2016-01-28 ENCOUNTER — Other Ambulatory Visit: Payer: Self-pay | Admitting: Family Medicine

## 2016-02-21 ENCOUNTER — Other Ambulatory Visit: Payer: Self-pay | Admitting: *Deleted

## 2016-02-21 DIAGNOSIS — F411 Generalized anxiety disorder: Secondary | ICD-10-CM

## 2016-02-21 MED ORDER — BUPROPION HCL 100 MG PO TABS: 150.0000 mg | ORAL_TABLET | Freq: Every day | ORAL | Status: DC

## 2016-02-21 NOTE — Telephone Encounter (Signed)
RF request for wellbutrin LOV: 08/28/15 Next ov: None Last written: 08/28/15 #135 w/ 1RF

## 2016-03-09 ENCOUNTER — Ambulatory Visit: Payer: 59 | Admitting: Family Medicine

## 2016-03-10 ENCOUNTER — Ambulatory Visit (INDEPENDENT_AMBULATORY_CARE_PROVIDER_SITE_OTHER): Payer: 59 | Admitting: Family Medicine

## 2016-03-10 ENCOUNTER — Encounter: Payer: Self-pay | Admitting: Family Medicine

## 2016-03-10 VITALS — BP 118/82 | HR 85 | Temp 98.9°F | Resp 20 | Wt 154.2 lb

## 2016-03-10 DIAGNOSIS — F411 Generalized anxiety disorder: Secondary | ICD-10-CM | POA: Diagnosis not present

## 2016-03-10 DIAGNOSIS — F409 Phobic anxiety disorder, unspecified: Secondary | ICD-10-CM | POA: Diagnosis not present

## 2016-03-10 MED ORDER — PAROXETINE HCL 30 MG PO TABS: ORAL_TABLET | ORAL | Status: DC

## 2016-03-10 MED ORDER — BUPROPION HCL 100 MG PO TABS: 150.0000 mg | ORAL_TABLET | Freq: Every day | ORAL | Status: DC

## 2016-03-10 NOTE — Patient Instructions (Signed)
Health Maintenance, Female Adopting a healthy lifestyle and getting preventive care can go a long way to promote health and wellness. Talk with your health care provider about what schedule of regular examinations is right for you. This is a good chance for you to check in with your provider about disease prevention and staying healthy. In between checkups, there are plenty of things you can do on your own. Experts have done a lot of research about which lifestyle changes and preventive measures are most likely to keep you healthy. Ask your health care provider for more information. WEIGHT AND DIET  Eat a healthy diet  Be sure to include plenty of vegetables, fruits, low-fat dairy products, and lean protein.  Do not eat a lot of foods high in solid fats, added sugars, or salt.  Get regular exercise. This is one of the most important things you can do for your health.  Most adults should exercise for at least 150 minutes each week. The exercise should increase your heart rate and make you sweat (moderate-intensity exercise).  Most adults should also do strengthening exercises at least twice a week. This is in addition to the moderate-intensity exercise.  Maintain a healthy weight  Body mass index (BMI) is a measurement that can be used to identify possible weight problems. It estimates body fat based on height and weight. Your health care provider can help determine your BMI and help you achieve or maintain a healthy weight.  For females 20 years of age and older:   A BMI below 18.5 is considered underweight.  A BMI of 18.5 to 24.9 is normal.  A BMI of 25 to 29.9 is considered overweight.  A BMI of 30 and above is considered obese.  Watch levels of cholesterol and blood lipids  You should start having your blood tested for lipids and cholesterol at 41 years of age, then have this test every 5 years.  You may need to have your cholesterol levels checked more often if:  Your lipid  or cholesterol levels are high.  You are older than 41 years of age.  You are at high risk for heart disease.  CANCER SCREENING   Lung Cancer  Lung cancer screening is recommended for adults 55-80 years old who are at high risk for lung cancer because of a history of smoking.  A yearly low-dose CT scan of the lungs is recommended for people who:  Currently smoke.  Have quit within the past 15 years.  Have at least a 30-pack-year history of smoking. A pack year is smoking an average of one pack of cigarettes a day for 1 year.  Yearly screening should continue until it has been 15 years since you quit.  Yearly screening should stop if you develop a health problem that would prevent you from having lung cancer treatment.  Breast Cancer  Practice breast self-awareness. This means understanding how your breasts normally appear and feel.  It also means doing regular breast self-exams. Let your health care provider know about any changes, no matter how small.  If you are in your 20s or 30s, you should have a clinical breast exam (CBE) by a health care provider every 1-3 years as part of a regular health exam.  If you are 40 or older, have a CBE every year. Also consider having a breast X-ray (mammogram) every year.  If you have a family history of breast cancer, talk to your health care provider about genetic screening.  If you   are at high risk for breast cancer, talk to your health care provider about having an MRI and a mammogram every year.  Breast cancer gene (BRCA) assessment is recommended for women who have family members with BRCA-related cancers. BRCA-related cancers include:  Breast.  Ovarian.  Tubal.  Peritoneal cancers.  Results of the assessment will determine the need for genetic counseling and BRCA1 and BRCA2 testing. Cervical Cancer Your health care provider may recommend that you be screened regularly for cancer of the pelvic organs (ovaries, uterus, and  vagina). This screening involves a pelvic examination, including checking for microscopic changes to the surface of your cervix (Pap test). You may be encouraged to have this screening done every 3 years, beginning at age 21.  For women ages 30-65, health care providers may recommend pelvic exams and Pap testing every 3 years, or they may recommend the Pap and pelvic exam, combined with testing for human papilloma virus (HPV), every 5 years. Some types of HPV increase your risk of cervical cancer. Testing for HPV may also be done on women of any age with unclear Pap test results.  Other health care providers may not recommend any screening for nonpregnant women who are considered low risk for pelvic cancer and who do not have symptoms. Ask your health care provider if a screening pelvic exam is right for you.  If you have had past treatment for cervical cancer or a condition that could lead to cancer, you need Pap tests and screening for cancer for at least 20 years after your treatment. If Pap tests have been discontinued, your risk factors (such as having a new sexual partner) need to be reassessed to determine if screening should resume. Some women have medical problems that increase the chance of getting cervical cancer. In these cases, your health care provider may recommend more frequent screening and Pap tests. Colorectal Cancer  This type of cancer can be detected and often prevented.  Routine colorectal cancer screening usually begins at 41 years of age and continues through 41 years of age.  Your health care provider may recommend screening at an earlier age if you have risk factors for colon cancer.  Your health care provider may also recommend using home test kits to check for hidden blood in the stool.  A small camera at the end of a tube can be used to examine your colon directly (sigmoidoscopy or colonoscopy). This is done to check for the earliest forms of colorectal  cancer.  Routine screening usually begins at age 50.  Direct examination of the colon should be repeated every 5-10 years through 41 years of age. However, you may need to be screened more often if early forms of precancerous polyps or small growths are found. Skin Cancer  Check your skin from head to toe regularly.  Tell your health care provider about any new moles or changes in moles, especially if there is a change in a mole's shape or color.  Also tell your health care provider if you have a mole that is larger than the size of a pencil eraser.  Always use sunscreen. Apply sunscreen liberally and repeatedly throughout the day.  Protect yourself by wearing long sleeves, pants, a wide-brimmed hat, and sunglasses whenever you are outside. HEART DISEASE, DIABETES, AND HIGH BLOOD PRESSURE   High blood pressure causes heart disease and increases the risk of stroke. High blood pressure is more likely to develop in:  People who have blood pressure in the high end   of the normal range (130-139/85-89 mm Hg).  People who are overweight or obese.  People who are African American.  If you are 38-23 years of age, have your blood pressure checked every 3-5 years. If you are 61 years of age or older, have your blood pressure checked every year. You should have your blood pressure measured twice--once when you are at a hospital or clinic, and once when you are not at a hospital or clinic. Record the average of the two measurements. To check your blood pressure when you are not at a hospital or clinic, you can use:  An automated blood pressure machine at a pharmacy.  A home blood pressure monitor.  If you are between 45 years and 39 years old, ask your health care provider if you should take aspirin to prevent strokes.  Have regular diabetes screenings. This involves taking a blood sample to check your fasting blood sugar level.  If you are at a normal weight and have a low risk for diabetes,  have this test once every three years after 41 years of age.  If you are overweight and have a high risk for diabetes, consider being tested at a younger age or more often. PREVENTING INFECTION  Hepatitis B  If you have a higher risk for hepatitis B, you should be screened for this virus. You are considered at high risk for hepatitis B if:  You were born in a country where hepatitis B is common. Ask your health care provider which countries are considered high risk.  Your parents were born in a high-risk country, and you have not been immunized against hepatitis B (hepatitis B vaccine).  You have HIV or AIDS.  You use needles to inject street drugs.  You live with someone who has hepatitis B.  You have had sex with someone who has hepatitis B.  You get hemodialysis treatment.  You take certain medicines for conditions, including cancer, organ transplantation, and autoimmune conditions. Hepatitis C  Blood testing is recommended for:  Everyone born from [birth year] through 1966.  Anyone with known risk factors for hepatitis C. Sexually transmitted infections (STIs)  You should be screened for sexually transmitted infections (STIs) including gonorrhea and chlamydia if:  You are sexually active and are younger than 41 years of age.  You are older than 41 years of age and your health care provider tells you that you are at risk for this type of infection.  Your sexual activity has changed since you were last screened and you are at an increased risk for chlamydia or gonorrhea. Ask your health care provider if you are at risk.  If you do not have HIV, but are at risk, it may be recommended that you take a prescription medicine daily to prevent HIV infection. This is called pre-exposure prophylaxis (PrEP). You are considered at risk if:  You are sexually active and do not regularly use condoms or know the HIV status of your partner(s).  You take drugs by injection.  You are sexually  active with a partner who has HIV. Talk with your health care provider about whether you are at high risk of being infected with HIV. If you choose to begin PrEP, you should first be tested for HIV. You should then be tested every 3 months for as long as you are taking PrEP.  PREGNANCY   If you are premenopausal and you may become pregnant, ask your health care provider about preconception counseling.  If you may  become pregnant, take 400 to 800 micrograms (mcg) of folic acid every day.  If you want to prevent pregnancy, talk to your health care provider about birth control (contraception). OSTEOPOROSIS AND MENOPAUSE   Osteoporosis is a disease in which the bones lose minerals and strength with aging. This can result in serious bone fractures. Your risk for osteoporosis can be identified using a bone density scan.  If you are 61 years of age or older, or if you are at risk for osteoporosis and fractures, ask your health care provider if you should be screened.  Ask your health care provider whether you should take a calcium or vitamin D supplement to lower your risk for osteoporosis.  Menopause may have certain physical symptoms and risks.  Hormone replacement therapy may reduce some of these symptoms and risks. Talk to your health care provider about whether hormone replacement therapy is right for you.  HOME CARE INSTRUCTIONS   Schedule regular health, dental, and eye exams.  Stay current with your immunizations.   Do not use any tobacco products including cigarettes, chewing tobacco, or electronic cigarettes.  If you are pregnant, do not drink alcohol.  If you are breastfeeding, limit how much and how often you drink alcohol.  Limit alcohol intake to no more than 1 drink per day for nonpregnant women. One drink equals 12 ounces of beer, 5 ounces of wine, or 1 ounces of hard liquor.  Do not use street drugs.  Do not share needles.  Ask your health care provider for help if  you need support or information about quitting drugs.  Tell your health care provider if you often feel depressed.  Tell your health care provider if you have ever been abused or do not feel safe at home.   This information is not intended to replace advice given to you by your health care provider. Make sure you discuss any questions you have with your health care provider.   Document Released: 05/18/2011 Document Revised: 11/23/2014 Document Reviewed: 10/04/2013 Elsevier Interactive Patient Education [employer].

## 2016-03-10 NOTE — Progress Notes (Signed)
Patient ID: ANNALESE STINER, female   DOB: 1976-06-05, 40 y.o.   MRN: [id number]   Subjective:    Patient ID: Claudette Head, female    DOB: Apr 11, 1976, 40 y.o.   MRN: [id number]  HPI  Generalized anxiety disorder: Patient has a history of generalized anxiety disorder, and phobic disorder. She has been on Wellbutrin and Paxil continuously for the last 3 months. She is tolerating her medications well, without side effect. She feels her anxiety is well controlled on this regimen.  Patient also has been prescribed 1 mg of Ativan 2 times a day as needed, patient states she rarely needs this medication any longer,but has it in the event she has a panic attack. 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 Patient is in need of refills for her Wellbutrin and her Paxil today.   Past Medical History  Diagnosis Date  . Allergy     seasonal  . ANXIETY DEPRESSION 11/21/2010  . ALLERGIC RHINITIS 11/21/2010  . Other specified anemias 11/21/2010  . Asthma   . Mastitis 06/14/2012  . Dehydration 06/14/2012  . Unspecified asthma(493.90) 03/18/2013   Allergies  Allergen Reactions  . Ciprofloxacin Other (See Comments)    GI upset   History reviewed. No pertinent past surgical history. Family History  Problem Relation Age of Onset  . Hyperlipidemia Mother   . Osteoarthritis Mother   . Diabetes Father     Type 2  . Ulcers Father   . Migraines Sister   . Bipolar disorder Brother   . Panic disorder Brother   . Arthritis Maternal Grandmother   . Cancer Maternal Grandmother     uterine  . Cancer Maternal Grandfather 33    colon  . Heart disease Maternal Grandfather   . Heart attack Maternal Grandfather   . Diabetes Paternal Grandmother   . Hypertension Paternal Grandmother   . Cancer Paternal Grandfather     multiple myeloma  . Anesthesia problems Neg Hx   . Hypotension Neg Hx   . Malignant hyperthermia Neg Hx   . Pseudochol deficiency Neg Hx     Social History   Social History  . Marital Status: Married    Spouse Name: N/A  . Number of Children: N/A  . Years of Education: N/A   Occupational History  . Not on file.   Social History Main Topics  . Smoking status: Never Smoker   . Smokeless tobacco: Never Used  . Alcohol Use: 0.0 oz/week    0 drink(s) per week     Comment: rare occasional glass of wine  . Drug Use: No  . Sexual Activity: Yes   Other Topics Concern  . Not on file   Social History Narrative     Review of Systems Negative, with the exception of above mentioned in HPI     Objective:   Physical Exam BP 118/82 mmHg  Pulse 85  Temp(Src) 98.9 F (37.2 C)  Resp 20  Wt 154 lb 4 oz (69.967 kg)  SpO2 98%  LMP 03/08/2016 Gen: Afebrile. No acute distress. Nontoxic in appearance, well-developed, well-nourished, Caucasian female. Cooperative with exam, very pleasant. Eyes:Pupils Equal Round Reactive to light, Extraocular movements intact,  Conjunctiva without redness, discharge or icterus. CV: RRR no murmur appreciated, no edema Chest: CTAB, no wheeze or crackles Psych: Normal affect, dress and demeanor. Normal speech. Normal thought content and judgment.     Assessment & Plan:  JOLETTE LANA is a  40 y.o. present for 6  month follow up on  Phobic anxiety disorder/generalized anxiety disorder: - Stable. Continue current regimen of Paxil 30 mg and Wellbutrin 150 mg. - Pt has ativan prescribed, for emergency, but has not had to use in some time.  - PARoxetine (PAXIL) 30 MG tablet; Take 1 tablet (30 mg total) by mouth daily.  Dispense: 90 tablet; Refill: 1 - buPROPion (WELLBUTRIN) 100 MG tablet; Take 1.5 tablets (150 mg total) by QD.  Dispense: 135 tablet; Refill: 1 - F/U 6 months.   Electronically Signed by: Howard Pouch, DO [facility] [place]

## 2016-06-16 ENCOUNTER — Other Ambulatory Visit: Payer: Self-pay | Admitting: *Deleted

## 2016-06-16 MED ORDER — FLUTICASONE PROPIONATE 50 MCG/ACT NA SUSP: 2.0000 | Freq: Every day | NASAL | 3 refills | Status: DC

## 2016-06-16 NOTE — Telephone Encounter (Signed)
Left message for patient to return call.

## 2016-06-16 NOTE — Telephone Encounter (Signed)
Fluticasone refill sent.

## 2016-06-23 ENCOUNTER — Encounter: Payer: 59 | Admitting: Family Medicine

## 2016-07-07 ENCOUNTER — Encounter: Payer: Self-pay | Admitting: Family Medicine

## 2016-07-07 ENCOUNTER — Ambulatory Visit (INDEPENDENT_AMBULATORY_CARE_PROVIDER_SITE_OTHER): Payer: 59 | Admitting: Family Medicine

## 2016-07-07 VITALS — BP 112/76 | HR 73 | Temp 99.1°F | Resp 20 | Ht 69.0 in | Wt 156.5 lb

## 2016-07-07 DIAGNOSIS — Z13220 Encounter for screening for lipoid disorders: Secondary | ICD-10-CM | POA: Diagnosis not present

## 2016-07-07 DIAGNOSIS — Z79899 Other long term (current) drug therapy: Secondary | ICD-10-CM

## 2016-07-07 DIAGNOSIS — Z130 Encounter for screening for diseases of the blood and blood-forming organs and certain disorders involving the immune mechanism: Secondary | ICD-10-CM

## 2016-07-07 DIAGNOSIS — Z0000 Encounter for general adult medical examination without abnormal findings: Secondary | ICD-10-CM

## 2016-07-07 DIAGNOSIS — Z131 Encounter for screening for diabetes mellitus: Secondary | ICD-10-CM

## 2016-07-07 DIAGNOSIS — Z1329 Encounter for screening for other suspected endocrine disorder: Secondary | ICD-10-CM

## 2016-07-07 DIAGNOSIS — Z136 Encounter for screening for cardiovascular disorders: Secondary | ICD-10-CM | POA: Diagnosis not present

## 2016-07-07 DIAGNOSIS — E559 Vitamin D deficiency, unspecified: Secondary | ICD-10-CM | POA: Diagnosis not present

## 2016-07-07 DIAGNOSIS — Z1231 Encounter for screening mammogram for malignant neoplasm of breast: Secondary | ICD-10-CM

## 2016-07-07 LAB — COMPREHENSIVE METABOLIC PANEL
ALK PHOS: 29 U/L — AB (ref 39–117)
ALT: 6 U/L (ref 0–35)
AST: 12 U/L (ref 0–37)
Albumin: 3.8 g/dL (ref 3.5–5.2)
BUN: 11 mg/dL (ref 6–23)
CO2: 28 mEq/L (ref 19–32)
Calcium: 8.3 mg/dL — ABNORMAL LOW (ref 8.4–10.5)
Chloride: 108 mEq/L (ref 96–112)
Creatinine, Ser: 0.88 mg/dL (ref 0.40–1.20)
GFR: 75.07 mL/min (ref >60.00)
GLUCOSE: 87 mg/dL (ref 70–99)
POTASSIUM: 3.9 meq/L (ref 3.5–5.1)
Sodium: 142 mEq/L (ref 135–145)
TOTAL PROTEIN: 5.7 g/dL — AB (ref 6.0–8.3)
Total Bilirubin: 0.3 mg/dL (ref 0.2–1.2)

## 2016-07-07 LAB — LIPID PANEL
CHOL/HDL RATIO: 3
Cholesterol: 173 mg/dL (ref 0–200)
HDL: 56.2 mg/dL (ref >39.00)
LDL CALC: 95 mg/dL (ref 0–99)
NONHDL: 116.53
Triglycerides: 108 mg/dL (ref 0.0–149.0)
VLDL: 21.6 mg/dL (ref 0.0–40.0)

## 2016-07-07 LAB — CBC WITH DIFFERENTIAL/PLATELET
BASOS ABS: 0 10*3/uL (ref 0.0–0.1)
Basophils Relative: 0.1 % (ref 0.0–3.0)
EOS ABS: 0 10*3/uL (ref 0.0–0.7)
Eosinophils Relative: 0.4 % (ref 0.0–5.0)
HEMATOCRIT: 37.1 % (ref 36.0–46.0)
HEMOGLOBIN: 12.6 g/dL (ref 12.0–15.0)
LYMPHS PCT: 34.5 % (ref 12.0–46.0)
Lymphs Abs: 1.3 10*3/uL (ref 0.7–4.0)
MCHC: 33.9 g/dL (ref 30.0–36.0)
MCV: 88.8 fl (ref 78.0–100.0)
Monocytes Absolute: 0.3 10*3/uL (ref 0.1–1.0)
Monocytes Relative: 7.3 % (ref 3.0–12.0)
Neutro Abs: 2.2 10*3/uL (ref 1.4–7.7)
Neutrophils Relative %: 57.7 % (ref 43.0–77.0)
Platelets: 141 10*3/uL — ABNORMAL LOW (ref 150.0–400.0)
RBC: 4.18 Mil/uL (ref 3.87–5.11)
RDW: 14 % (ref 11.5–15.5)
WBC: 3.8 10*3/uL — AB (ref 4.0–10.5)

## 2016-07-07 LAB — VITAMIN D 25 HYDROXY (VIT D DEFICIENCY, FRACTURES): VITD: 42.98 ng/mL (ref 30.00–100.00)

## 2016-07-07 LAB — TSH: TSH: 0.9 u[IU]/mL (ref 0.35–4.50)

## 2016-07-07 LAB — HEMOGLOBIN A1C: Hgb A1c MFr Bld: 5.1 % (ref 4.6–6.5)

## 2016-07-07 MED ORDER — PAROXETINE HCL 30 MG PO TABS: ORAL_TABLET | ORAL | 1 refills | Status: DC

## 2016-07-07 NOTE — Progress Notes (Signed)
Patient ID: IRASEMA CHALK, female  DOB: Mar 12, 1976, 40 y.o.   MRN: [id number] Patient Care Team    Relationship Specialty Notifications Start End  Ma Hillock, DO PCP - General Family Medicine  08/28/15     Subjective:  SHARIFAH CHAMPINE is a 40 y.o.  female present for CPE. Pt is due for PAP, but has started her menses. She reports her cycles are about every 21-30 days, which seems to be shortening for her. They last about 4 days with 2 days of heavy and 2 days of light menses. She does not perform SBE. She has not had a screening mammogram. She otherwise has no complaints, and doing well on prescribed medications for anxiety/depression.   Health maintenance:  Colonoscopy: MGF colon cancer (39s). Screen at 50. Mammogram: screening ordered. No Fhx. (closest to [facility])  Cervical cancer screening: last 02/2013; normal. Pt will make appt within a few weeks to have PAP completed (with HPV).  Immunizations: tdap 2013, Influenza (encouraged yearly) Infectious disease screening: HIV  2013 DEXA:never; mother severe osteoporosis.  Assistive device: None Oxygen use: none Patient has a Dental home. Hospitalizations/ED visits: None  Immunization History  Administered Date(s) Administered  . Influenza Split 09/10/2011, 09/14/2012  . Influenza,inj,Quad PF,36+ Mos 08/23/2013, 10/02/2014, 08/28/2015  . Tdap 03/05/2012     Past Medical History:  Diagnosis Date  . ALLERGIC RHINITIS 11/21/2010  . Allergy    seasonal  . ANXIETY DEPRESSION 11/21/2010  . Asthma   . Dehydration 06/14/2012  . Mastitis 06/14/2012  . Other specified anemias 11/21/2010  . Unspecified asthma(493.90) 03/18/2013   Allergies  Allergen Reactions  . Ciprofloxacin Other (See Comments)    GI upset   History reviewed. No pertinent surgical history. Family History  Problem Relation Age of Onset  . Hyperlipidemia Mother   . Osteoarthritis Mother   . Diabetes Father     Type 2  . Ulcers Father   . Migraines  Sister   . Bipolar disorder Brother   . Panic disorder Brother   . Arthritis Maternal Grandmother   . Cancer Maternal Grandmother     uterine  . Cancer Maternal Grandfather 98    colon  . Heart disease Maternal Grandfather   . Heart attack Maternal Grandfather   . Diabetes Paternal Grandmother   . Hypertension Paternal Grandmother   . Cancer Paternal Grandfather     multiple myeloma  . Anesthesia problems Neg Hx   . Hypotension Neg Hx   . Malignant hyperthermia Neg Hx   . Pseudochol deficiency Neg Hx    Social History   Social History  . Marital status: Married    Spouse name: N/A  . Number of children: N/A  . Years of education: N/A   Occupational History  . Not on file.   Social History Main Topics  . Smoking status: Never Smoker  . Smokeless tobacco: Never Used  . Alcohol use 0.0 oz/week    0 drink(s) per week     Comment: rare occasional glass of wine  . Drug use: No  . Sexual activity: Yes   Other Topics Concern  . Not on file   Social History Narrative  . No narrative on file     Medication List       Accurate as of 07/07/16 10:51 AM. Always use your most recent med list.          albuterol 108 (90 Base) MCG/ACT inhaler Commonly known as:  PROAIR  HFA Inhale 2 puffs into the lungs every 6 (six) hours as needed. For wheezing   buPROPion 100 MG tablet Commonly known as:  WELLBUTRIN Take 1.5 tablets (150 mg total) by mouth daily.   fluticasone 50 MCG/ACT nasal spray Commonly known as:  FLONASE Place 2 sprays into both nostrils daily.   LORazepam 1 MG tablet Commonly known as:  ATIVAN Place 1 tablet (1 mg total) under the tongue 2 (two) times daily as needed for anxiety.   multivitamin tablet Take 1 tablet by mouth daily.   PARoxetine 30 MG tablet Commonly known as:  PAXIL Take 1 tablet by mouth  daily   Vitamin D3 50000 units Caps Take 1 capsule by mouth every 7 (seven) days.        No results found for this or any previous visit  (from the past 2160 hour(s)).  No results found.   ROS: 14 pt review of systems performed and negative (unless mentioned in an HPI)  Objective: BP 112/76 (BP Location: Left Arm, Patient Position: Sitting, Cuff Size: Normal)   Pulse 73   Temp 99.1 F (37.3 C)   Resp 20   Ht _0  (1.753 m)   Wt 156 lb 8 oz (71 kg)   LMP 07/07/2016 (Exact Date)   SpO2 99%   BMI 23.11 kg/m  Gen: Afebrile. No acute distress. Nontoxic in appearance, well-developed, well-nourished,  Pleasant, caucasian female.  HENT: AT. [place]. Bilateral TM visualized and normal in appearance, normal external auditory canal. MMM, no oral lesions, adequate dentition. Bilateral nares within normal limits (mild erythema). Throat without erythema, ulcerations or exudates. no Cough on exam, no hoarseness on exam. Eyes:Pupils Equal Round Reactive to light, Extraocular movements intact,  Conjunctiva without redness, discharge or icterus. Neck/lymp/endocrine: Supple,no lymphadenopathy, no thyromegaly CV: RRR no murmur, no edema, +2/4 P posterior tibialis pulses.  Chest: CTAB, no wheeze, rhonchi or crackles. normal Respiratory effort. good Air movement. Abd: Soft. flat. NTND. BS present. no Masses palpated. No hepatosplenomegaly. No rebound tenderness or guarding. Skin: no rashes, purpura or petechiae. Warm and well-perfused. Skin intact. Neuro/Msk:  Normal gait. PERLA. EOMi. Alert. Oriented x3.  Cranial nerves II through XII intact. Muscle strength 5/5 upper/lower extremity. DTRs equal bilaterally. Psych: Normal affect, dress and demeanor. Normal speech. Normal thought content and judgment.   Assessment/plan: SIREN PORRATA is a 40 y.o. female present for CPE. Encounter for preventive health examination Patient was encouraged to exercise greater than 150 minutes a week. Patient was encouraged to choose a diet filled with fresh fruits and vegetables, and lean meats. AVS provided to patient today for education/recommendation on  gender specific health and safety maintenance. Colonoscopy: MGF colon cancer (92s). Screen at 50. Mammogram: screening ordered. No Fhx. (closest to [facility])  Cervical cancer screening: last 02/2013; normal. Pt will make appt within a few weeks to have PAP completed (with HPV).  Immunizations: tdap 2013, Influenza (encouraged yearly) Infectious disease screening: HIV  2013 DEXA:never; mother severe osteoporosis. Encouraged at least 800u Vit D and 1200 mg calcium supplement. She has a h/o vit d deficiency, will retest level today.  Vitamin D deficiency - Vitamin D (25 hydroxy) Diabetes mellitus screening - Hemoglobin A1c Thyroid disorder screen - TSH Screening, anemia, deficiency, iron - CBC w/Diff Encounter for screening mammogram for breast cancer - MM Digital Screening; Future Encounter for long-term (current) use of medications - Comp Met (CMET) Encounter for lipid screening for cardiovascular disease - Lipid panel   Continue q 6 ms on  chronic medical conditions. Yearly on CPE PAP to be scheduled 2017, at her convenience.   Electronically signed by: Howard Pouch, DO [place]

## 2016-07-07 NOTE — Patient Instructions (Signed)
Health Maintenance, Female Adopting a healthy lifestyle and getting preventive care can go a long way to promote health and wellness. Talk with your health care provider about what schedule of regular examinations is right for you. This is a good chance for you to check in with your provider about disease prevention and staying healthy. In between checkups, there are plenty of things you can do on your own. Experts have done a lot of research about which lifestyle changes and preventive measures are most likely to keep you healthy. Ask your health care provider for more information. WEIGHT AND DIET  Eat a healthy diet  Be sure to include plenty of vegetables, fruits, low-fat dairy products, and lean protein.  Do not eat a lot of foods high in solid fats, added sugars, or salt.  Get regular exercise. This is one of the most important things you can do for your health.  Most adults should exercise for at least 150 minutes each week. The exercise should increase your heart rate and make you sweat (moderate-intensity exercise).  Most adults should also do strengthening exercises at least twice a week. This is in addition to the moderate-intensity exercise.  Maintain a healthy weight  Body mass index (BMI) is a measurement that can be used to identify possible weight problems. It estimates body fat based on height and weight. Your health care provider can help determine your BMI and help you achieve or maintain a healthy weight.  For females 20 years of age and older:   A BMI below 18.5 is considered underweight.  A BMI of 18.5 to 24.9 is normal.  A BMI of 25 to 29.9 is considered overweight.  A BMI of 30 and above is considered obese.  Watch levels of cholesterol and blood lipids  You should start having your blood tested for lipids and cholesterol at 41 years of age, then have this test every 5 years.  You may need to have your cholesterol levels checked more often if:  Your lipid  or cholesterol levels are high.  You are older than 41 years of age.  You are at high risk for heart disease.  CANCER SCREENING   Lung Cancer  Lung cancer screening is recommended for adults 55-80 years old who are at high risk for lung cancer because of a history of smoking.  A yearly low-dose CT scan of the lungs is recommended for people who:  Currently smoke.  Have quit within the past 15 years.  Have at least a 30-pack-year history of smoking. A pack year is smoking an average of one pack of cigarettes a day for 1 year.  Yearly screening should continue until it has been 15 years since you quit.  Yearly screening should stop if you develop a health problem that would prevent you from having lung cancer treatment.  Breast Cancer  Practice breast self-awareness. This means understanding how your breasts normally appear and feel.  It also means doing regular breast self-exams. Let your health care provider know about any changes, no matter how small.  If you are in your 20s or 30s, you should have a clinical breast exam (CBE) by a health care provider every 1-3 years as part of a regular health exam.  If you are 40 or older, have a CBE every year. Also consider having a breast X-ray (mammogram) every year.  If you have a family history of breast cancer, talk to your health care provider about genetic screening.  If you   are at high risk for breast cancer, talk to your health care provider about having an MRI and a mammogram every year.  Breast cancer gene (BRCA) assessment is recommended for women who have family members with BRCA-related cancers. BRCA-related cancers include:  Breast.  Ovarian.  Tubal.  Peritoneal cancers.  Results of the assessment will determine the need for genetic counseling and BRCA1 and BRCA2 testing. Cervical Cancer Your health care provider may recommend that you be screened regularly for cancer of the pelvic organs (ovaries, uterus, and  vagina). This screening involves a pelvic examination, including checking for microscopic changes to the surface of your cervix (Pap test). You may be encouraged to have this screening done every 3 years, beginning at age 21.  For women ages 30-65, health care providers may recommend pelvic exams and Pap testing every 3 years, or they may recommend the Pap and pelvic exam, combined with testing for human papilloma virus (HPV), every 5 years. Some types of HPV increase your risk of cervical cancer. Testing for HPV may also be done on women of any age with unclear Pap test results.  Other health care providers may not recommend any screening for nonpregnant women who are considered low risk for pelvic cancer and who do not have symptoms. Ask your health care provider if a screening pelvic exam is right for you.  If you have had past treatment for cervical cancer or a condition that could lead to cancer, you need Pap tests and screening for cancer for at least 20 years after your treatment. If Pap tests have been discontinued, your risk factors (such as having a new sexual partner) need to be reassessed to determine if screening should resume. Some women have medical problems that increase the chance of getting cervical cancer. In these cases, your health care provider may recommend more frequent screening and Pap tests. Colorectal Cancer  This type of cancer can be detected and often prevented.  Routine colorectal cancer screening usually begins at 41 years of age and continues through 41 years of age.  Your health care provider may recommend screening at an earlier age if you have risk factors for colon cancer.  Your health care provider may also recommend using home test kits to check for hidden blood in the stool.  A small camera at the end of a tube can be used to examine your colon directly (sigmoidoscopy or colonoscopy). This is done to check for the earliest forms of colorectal  cancer.  Routine screening usually begins at age 50.  Direct examination of the colon should be repeated every 5-10 years through 41 years of age. However, you may need to be screened more often if early forms of precancerous polyps or small growths are found. Skin Cancer  Check your skin from head to toe regularly.  Tell your health care provider about any new moles or changes in moles, especially if there is a change in a mole's shape or color.  Also tell your health care provider if you have a mole that is larger than the size of a pencil eraser.  Always use sunscreen. Apply sunscreen liberally and repeatedly throughout the day.  Protect yourself by wearing long sleeves, pants, a wide-brimmed hat, and sunglasses whenever you are outside. HEART DISEASE, DIABETES, AND HIGH BLOOD PRESSURE   High blood pressure causes heart disease and increases the risk of stroke. High blood pressure is more likely to develop in:  People who have blood pressure in the high end   of the normal range (130-139/85-89 mm Hg).  People who are overweight or obese.  People who are African American.  If you are 38-23 years of age, have your blood pressure checked every 3-5 years. If you are 61 years of age or older, have your blood pressure checked every year. You should have your blood pressure measured twice--once when you are at a hospital or clinic, and once when you are not at a hospital or clinic. Record the average of the two measurements. To check your blood pressure when you are not at a hospital or clinic, you can use:  An automated blood pressure machine at a pharmacy.  A home blood pressure monitor.  If you are between 45 years and 39 years old, ask your health care provider if you should take aspirin to prevent strokes.  Have regular diabetes screenings. This involves taking a blood sample to check your fasting blood sugar level.  If you are at a normal weight and have a low risk for diabetes,  have this test once every three years after 41 years of age.  If you are overweight and have a high risk for diabetes, consider being tested at a younger age or more often. PREVENTING INFECTION  Hepatitis B  If you have a higher risk for hepatitis B, you should be screened for this virus. You are considered at high risk for hepatitis B if:  You were born in a country where hepatitis B is common. Ask your health care provider which countries are considered high risk.  Your parents were born in a high-risk country, and you have not been immunized against hepatitis B (hepatitis B vaccine).  You have HIV or AIDS.  You use needles to inject street drugs.  You live with someone who has hepatitis B.  You have had sex with someone who has hepatitis B.  You get hemodialysis treatment.  You take certain medicines for conditions, including cancer, organ transplantation, and autoimmune conditions. Hepatitis C  Blood testing is recommended for:  Everyone born from [birth year] through 1966.  Anyone with known risk factors for hepatitis C. Sexually transmitted infections (STIs)  You should be screened for sexually transmitted infections (STIs) including gonorrhea and chlamydia if:  You are sexually active and are younger than 41 years of age.  You are older than 41 years of age and your health care provider tells you that you are at risk for this type of infection.  Your sexual activity has changed since you were last screened and you are at an increased risk for chlamydia or gonorrhea. Ask your health care provider if you are at risk.  If you do not have HIV, but are at risk, it may be recommended that you take a prescription medicine daily to prevent HIV infection. This is called pre-exposure prophylaxis (PrEP). You are considered at risk if:  You are sexually active and do not regularly use condoms or know the HIV status of your partner(s).  You take drugs by injection.  You are sexually  active with a partner who has HIV. Talk with your health care provider about whether you are at high risk of being infected with HIV. If you choose to begin PrEP, you should first be tested for HIV. You should then be tested every 3 months for as long as you are taking PrEP.  PREGNANCY   If you are premenopausal and you may become pregnant, ask your health care provider about preconception counseling.  If you may  become pregnant, take 400 to 800 micrograms (mcg) of folic acid every day.  If you want to prevent pregnancy, talk to your health care provider about birth control (contraception). OSTEOPOROSIS AND MENOPAUSE   Osteoporosis is a disease in which the bones lose minerals and strength with aging. This can result in serious bone fractures. Your risk for osteoporosis can be identified using a bone density scan.  If you are 61 years of age or older, or if you are at risk for osteoporosis and fractures, ask your health care provider if you should be screened.  Ask your health care provider whether you should take a calcium or vitamin D supplement to lower your risk for osteoporosis.  Menopause may have certain physical symptoms and risks.  Hormone replacement therapy may reduce some of these symptoms and risks. Talk to your health care provider about whether hormone replacement therapy is right for you.  HOME CARE INSTRUCTIONS   Schedule regular health, dental, and eye exams.  Stay current with your immunizations.   Do not use any tobacco products including cigarettes, chewing tobacco, or electronic cigarettes.  If you are pregnant, do not drink alcohol.  If you are breastfeeding, limit how much and how often you drink alcohol.  Limit alcohol intake to no more than 1 drink per day for nonpregnant women. One drink equals 12 ounces of beer, 5 ounces of wine, or 1 ounces of hard liquor.  Do not use street drugs.  Do not share needles.  Ask your health care provider for help if  you need support or information about quitting drugs.  Tell your health care provider if you often feel depressed.  Tell your health care provider if you have ever been abused or do not feel safe at home.   This information is not intended to replace advice given to you by your health care provider. Make sure you discuss any questions you have with your health care provider.   Document Released: 05/18/2011 Document Revised: 11/23/2014 Document Reviewed: 10/04/2013 Elsevier Interactive Patient Education [employer].

## 2016-07-09 ENCOUNTER — Other Ambulatory Visit: Payer: Self-pay | Admitting: Family Medicine

## 2016-07-09 NOTE — Progress Notes (Signed)
Called pt to discuss her lab work today.  - Briefly her CBC resulted with mildly low WBC, and platelets. This is a consistent finding with labs 3 years ago, that had normalized.  - Changes from prior labs include mildly low calcium and total protein. Her alk phos is low at 29.  - She has not changed her diet or supplements. She has not experienced weight loss and she is not having any complaints/symptoms.  - discussed adding additional lab testing on her PAP appt in 2 weeks to further investigate. Pt agreeable.

## 2016-07-21 ENCOUNTER — Ambulatory Visit (HOSPITAL_BASED_OUTPATIENT_CLINIC_OR_DEPARTMENT_OTHER): Payer: 59

## 2016-07-22 ENCOUNTER — Ambulatory Visit (INDEPENDENT_AMBULATORY_CARE_PROVIDER_SITE_OTHER): Payer: 59 | Admitting: Family Medicine

## 2016-07-22 ENCOUNTER — Other Ambulatory Visit (HOSPITAL_COMMUNITY)
Admission: RE | Admit: 2016-07-22 | Discharge: 2016-07-22 | Disposition: A | Discharge status: Home / Self Care | Payer: 59 | Source: Ambulatory Visit | Attending: Family Medicine | Admitting: Family Medicine

## 2016-07-22 VITALS — BP 114/79 | HR 88 | Temp 99.2°F | Resp 16 | Ht 69.0 in | Wt 155.0 lb

## 2016-07-22 DIAGNOSIS — Z124 Encounter for screening for malignant neoplasm of cervix: Secondary | ICD-10-CM

## 2016-07-22 DIAGNOSIS — Z01419 Encounter for gynecological examination (general) (routine) without abnormal findings: Secondary | ICD-10-CM | POA: Insufficient documentation

## 2016-07-22 DIAGNOSIS — Z1151 Encounter for screening for human papillomavirus (HPV): Secondary | ICD-10-CM | POA: Diagnosis not present

## 2016-07-22 NOTE — Patient Instructions (Signed)
Pt left prior to receiving handout.

## 2016-07-22 NOTE — Progress Notes (Signed)
[name], female  DOB: 12/14/1975, 40 y.o.   MRN: [id number] Patient Care Team    Relationship Specialty Notifications Start End  Ma Hillock, DO PCP - General Family Medicine  08/28/15     Subjective:  [name] is a 40 y.o.  Female  present for PAP exam. Pt was seen a few weeks ago for CPE and exam was unable to be completed secondary to menstruation.  All past medical history, surgical history, allergies, family history, immunizations, medications and social history were updated in the electronic medical record today. All recent labs, ED visits and hospitalizations within the last year were reviewed.  Health maintenance:  Colonoscopy: MGF colon cancer (19s). Screen at 50. Mammogram: screening ordered next Tuesday. No Fhx. (closest to [facility])  Cervical cancer screening: last 02/2013; normal. PAP completed (with HPV) today Immunizations: tdap 2013, Influenza (encouraged yearly) Infectious disease screening: HIV  2013 DEXA:never; mother severe osteoporosis.    Immunization History  Administered Date(s) Administered  . Influenza Split 09/10/2011, 09/14/2012  . Influenza,inj,Quad PF,36+ Mos 08/23/2013, 10/02/2014, 08/28/2015  . Tdap 03/05/2012     Past Medical History:  Diagnosis Date  . ALLERGIC RHINITIS 11/21/2010  . Allergy    seasonal  . ANXIETY DEPRESSION 11/21/2010  . Asthma   . Dehydration 06/14/2012  . Mastitis 06/14/2012  . Other specified anemias 11/21/2010  . Unspecified asthma(493.90) 03/18/2013   Allergies  Allergen Reactions  . Ciprofloxacin Other (See Comments)    GI upset   No past surgical history on file. Family History  Problem Relation Age of Onset  . Hyperlipidemia Mother   . Osteoarthritis Mother   . Diabetes Father     Type 2  . Ulcers Father   . Migraines Sister   . Bipolar disorder Brother   . Panic disorder Brother   . Arthritis Maternal Grandmother   . Cancer Maternal Grandmother     uterine  . Cancer  Maternal Grandfather 17    colon  . Heart disease Maternal Grandfather   . Heart attack Maternal Grandfather   . Diabetes Paternal Grandmother   . Hypertension Paternal Grandmother   . Cancer Paternal Grandfather     multiple myeloma  . Anesthesia problems Neg Hx   . Hypotension Neg Hx   . Malignant hyperthermia Neg Hx   . Pseudochol deficiency Neg Hx    Social History   Social History  . Marital status: Married    Spouse name: N/A  . Number of children: N/A  . Years of education: N/A   Occupational History  . Not on file.   Social History Main Topics  . Smoking status: Never Smoker  . Smokeless tobacco: Never Used  . Alcohol use 0.0 oz/week    0 drink(s) per week     Comment: rare occasional glass of wine  . Drug use: No  . Sexual activity: Yes   Other Topics Concern  . Not on file   Social History Narrative  . No narrative on file     Medication List       Accurate as of 07/22/16 11:16 AM. Always use your most recent med list.          albuterol 108 (90 Base) MCG/ACT inhaler Commonly known as:  PROAIR HFA Inhale 2 puffs into the lungs every 6 (six) hours as needed. For wheezing   buPROPion 100 MG tablet Commonly known as:  WELLBUTRIN Take 1.5 tablets (150 mg total) by  mouth daily.   fluticasone 50 MCG/ACT nasal spray Commonly known as:  FLONASE Place 2 sprays into both nostrils daily.   LORazepam 1 MG tablet Commonly known as:  ATIVAN Place 1 tablet (1 mg total) under the tongue 2 (two) times daily as needed for anxiety.   multivitamin tablet Take 1 tablet by mouth daily.   PARoxetine 30 MG tablet Commonly known as:  PAXIL Take 1 tablet by mouth  daily        Recent Results (from the past 2160 hour(s))  Vitamin D (25 hydroxy)     Status: None   Collection Time: 07/07/16 11:38 AM  Result Value Ref Range   VITD 42.98 30.00 - 100.00 ng/mL  Hemoglobin A1c     Status: None   Collection Time: 07/07/16 11:38 AM  Result Value Ref Range   Hgb  A1c MFr Bld 5.1 4.6 - 6.5 %    Comment: Glycemic Control Guidelines for People with Diabetes:Non Diabetic:  <6%Goal of Therapy: <7%Additional Action Suggested:  >8%   TSH     Status: None   Collection Time: 07/07/16 11:38 AM  Result Value Ref Range   TSH 0.90 0.35 - 4.50 uIU/mL  Lipid panel     Status: None   Collection Time: 07/07/16 11:38 AM  Result Value Ref Range   Cholesterol 173 0 - 200 mg/dL    Comment: ATP III Classification       Desirable:  < 200 mg/dL               Borderline High:  200 - 239 mg/dL          High:  > = 240 mg/dL   Triglycerides 108.0 0.0 - 149.0 mg/dL    Comment: Normal:  <150 mg/dLBorderline High:  150 - 199 mg/dL   HDL 56.20 >39.00 mg/dL   VLDL 21.6 0.0 - 40.0 mg/dL   LDL Cholesterol 95 0 - 99 mg/dL   Total CHOL/HDL Ratio 3     Comment:                Men          Women1/2 Average Risk     3.4          3.3Average Risk          5.0          4.42X Average Risk          9.6          7.13X Average Risk          15.0          11.0                       NonHDL 116.53     Comment: NOTE:  Non-HDL goal should be 30 mg/dL higher than patient's LDL goal (i.e. LDL goal of < 70 mg/dL, would have non-HDL goal of < 100 mg/dL)  CBC w/Diff     Status: Abnormal   Collection Time: 07/07/16 11:38 AM  Result Value Ref Range   WBC 3.8 (L) 4.0 - 10.5 K/uL   RBC 4.18 3.87 - 5.11 Mil/uL   Hemoglobin 12.6 12.0 - 15.0 g/dL   HCT 37.1 36.0 - 46.0 %   MCV 88.8 78.0 - 100.0 fl   MCHC 33.9 30.0 - 36.0 g/dL   RDW 14.0 11.5 - 15.5 %   Platelets 141.0 (L) 150.0 - 400.0 K/uL   Neutrophils Relative %  57.7 43.0 - 77.0 %   Lymphocytes Relative 34.5 12.0 - 46.0 %   Monocytes Relative 7.3 3.0 - 12.0 %   Eosinophils Relative 0.4 0.0 - 5.0 %   Basophils Relative 0.1 0.0 - 3.0 %   Neutro Abs 2.2 1.4 - 7.7 K/uL   Lymphs Abs 1.3 0.7 - 4.0 K/uL   Monocytes Absolute 0.3 0.1 - 1.0 K/uL   Eosinophils Absolute 0.0 0.0 - 0.7 K/uL   Basophils Absolute 0.0 0.0 - 0.1 K/uL  Comp Met (CMET)     Status:  Abnormal   Collection Time: 07/07/16 11:38 AM  Result Value Ref Range   Sodium 142 135 - 145 mEq/L   Potassium 3.9 3.5 - 5.1 mEq/L   Chloride 108 96 - 112 mEq/L   CO2 28 19 - 32 mEq/L   Glucose, Bld 87 70 - 99 mg/dL   BUN 11 6 - 23 mg/dL   Creatinine, Ser 0.88 0.40 - 1.20 mg/dL   Total Bilirubin 0.3 0.2 - 1.2 mg/dL   Alkaline Phosphatase 29 (L) 39 - 117 U/L   AST 12 0 - 37 U/L   ALT 6 0 - 35 U/L   Total Protein 5.7 (L) 6.0 - 8.3 g/dL   Albumin 3.8 3.5 - 5.2 g/dL   Calcium 8.3 (L) 8.4 - 10.5 mg/dL   GFR 75.07 >60.00 mL/min    No results found.   ROS: 14 pt review of systems performed and negative (unless mentioned in an HPI)  Objective: BP 114/79 (BP Location: Left Arm, Patient Position: Sitting, Cuff Size: Normal)   Pulse 88   Temp 99.2 F (37.3 C) (Oral)   Resp 16   Ht 5' 9"  (1.753 m)   Wt 155 lb (70.3 kg)   LMP 07/10/2016   SpO2 97%   BMI 22.89 kg/m  Gen: Afebrile. No acute distress. Nontoxic in appearance, well-developed, well-nourished Breasts: breasts appear normal, symmetrical, no tenderness on exam, no suspicious masses, no skin or nipple changes or axillary nodes. GYN:  External genitalia within normal limits, normal hair distribution, no lesions. Urethral meatus normal, no lesions. Vaginal mucosa pink, moist, normal rugae, no lesions. No cystocele or rectocele. Anterior cervix with mild friability, nabothian cyst, no lesions, no discharge. Bimanual exam revealed  normal uterus.  No bladder/suprapubic fullness, masses or tenderness. No cervical motion tenderness. No adnexal fullness. Anus and perineum within normal limits, no lesions.   Assessment/plan: [name] is a 40 y.o. female present for PAP and follow up on labs.  Pap smear for cervical cancer screening - Cytology - PAP with HPV. - mammogram schedule this week. No Fhx, depending on results and pt desire 1-2 year repeat.  - SBE encouraged.  - discussed mildly abnormal labs with pt today. She is  willing to wait to have collection since she is feeling ok.  - would repeat cbc, cmp in 3 months (lab appt ok).    Electronically signed by: Howard Pouch, DO [place]

## 2016-07-23 LAB — CYTOLOGY - PAP

## 2016-07-24 ENCOUNTER — Telehealth: Payer: Self-pay | Admitting: Family Medicine

## 2016-07-24 NOTE — Telephone Encounter (Signed)
Spoke with patient reviewed results . Patient verbalized understanding. 

## 2016-07-24 NOTE — Telephone Encounter (Signed)
Please call pt: - her PAP was normal (negative HPV). Repeat 3 years.

## 2016-07-28 ENCOUNTER — Ambulatory Visit (HOSPITAL_BASED_OUTPATIENT_CLINIC_OR_DEPARTMENT_OTHER)
Admission: RE | Admit: 2016-07-28 | Discharge: 2016-07-28 | Disposition: A | Discharge status: Home / Self Care | Payer: 59 | Source: Ambulatory Visit | Attending: Family Medicine | Admitting: Family Medicine

## 2016-07-28 DIAGNOSIS — Z1231 Encounter for screening mammogram for malignant neoplasm of breast: Secondary | ICD-10-CM | POA: Insufficient documentation

## 2016-09-03 ENCOUNTER — Ambulatory Visit (INDEPENDENT_AMBULATORY_CARE_PROVIDER_SITE_OTHER): Payer: 59

## 2016-09-03 DIAGNOSIS — Z23 Encounter for immunization: Secondary | ICD-10-CM | POA: Diagnosis not present

## 2016-12-14 ENCOUNTER — Other Ambulatory Visit: Payer: Self-pay | Admitting: *Deleted

## 2016-12-14 DIAGNOSIS — F411 Generalized anxiety disorder: Secondary | ICD-10-CM

## 2016-12-14 MED ORDER — BUPROPION HCL 100 MG PO TABS: 150.0000 mg | ORAL_TABLET | Freq: Every day | ORAL | 1 refills | Status: DC

## 2016-12-14 MED ORDER — FLUTICASONE PROPIONATE 50 MCG/ACT NA SUSP: 2.0000 | Freq: Every day | NASAL | 3 refills | Status: DC

## 2016-12-14 MED ORDER — PAROXETINE HCL 30 MG PO TABS: ORAL_TABLET | ORAL | 1 refills | Status: DC

## 2016-12-14 NOTE — Telephone Encounter (Signed)
Refills on patient medications sent to new mail order pharmacy.

## 2016-12-16 ENCOUNTER — Other Ambulatory Visit: Payer: Self-pay | Admitting: *Deleted

## 2016-12-16 MED ORDER — PAROXETINE HCL 30 MG PO TABS: 30.0000 mg | ORAL_TABLET | Freq: Every day | ORAL | 0 refills | Status: DC

## 2016-12-16 NOTE — Telephone Encounter (Signed)
Patient has not received her medication from the mail order pharmacy sent Rx for 15 day supply of paxil to [facility] to cover until mail order meds arrive.

## 2016-12-17 ENCOUNTER — Other Ambulatory Visit: Payer: Self-pay | Admitting: *Deleted

## 2016-12-17 MED ORDER — ALBUTEROL SULFATE HFA 108 (90 BASE) MCG/ACT IN AERS: 2.0000 | INHALATION_SPRAY | Freq: Four times a day (QID) | RESPIRATORY_TRACT | 2 refills | Status: DC | PRN

## 2016-12-17 NOTE — Telephone Encounter (Signed)
Albuterol refill sent to patient local pharmacy.

## 2017-05-03 ENCOUNTER — Other Ambulatory Visit: Payer: Self-pay | Admitting: Family Medicine

## 2017-06-13 ENCOUNTER — Other Ambulatory Visit: Payer: Self-pay | Admitting: Family Medicine

## 2017-06-15 ENCOUNTER — Other Ambulatory Visit: Payer: Self-pay

## 2017-06-15 MED ORDER — PAROXETINE HCL 30 MG PO TABS: 30.0000 mg | ORAL_TABLET | Freq: Every day | ORAL | 0 refills | Status: DC

## 2017-06-15 NOTE — Telephone Encounter (Signed)
Patient requesting 15 day prescription to fill until mail in comes.

## 2017-06-29 ENCOUNTER — Encounter: Payer: Self-pay | Admitting: Family Medicine

## 2017-06-29 ENCOUNTER — Ambulatory Visit (INDEPENDENT_AMBULATORY_CARE_PROVIDER_SITE_OTHER): Payer: 59 | Admitting: Family Medicine

## 2017-06-29 VITALS — BP 115/77 | HR 98 | Temp 98.3°F | Resp 20 | Wt 156.5 lb

## 2017-06-29 DIAGNOSIS — F411 Generalized anxiety disorder: Secondary | ICD-10-CM

## 2017-06-29 DIAGNOSIS — F4010 Social phobia, unspecified: Secondary | ICD-10-CM

## 2017-06-29 DIAGNOSIS — F409 Phobic anxiety disorder, unspecified: Secondary | ICD-10-CM | POA: Diagnosis not present

## 2017-06-29 MED ORDER — PAROXETINE HCL 30 MG PO TABS: 30.0000 mg | ORAL_TABLET | Freq: Every day | ORAL | 1 refills | Status: DC

## 2017-06-29 MED ORDER — LORAZEPAM 1 MG PO TABS: 1.0000 mg | ORAL_TABLET | Freq: Two times a day (BID) | ORAL | 1 refills | Status: DC | PRN

## 2017-06-29 MED ORDER — BUPROPION HCL 100 MG PO TABS: 150.0000 mg | ORAL_TABLET | Freq: Every day | ORAL | 1 refills | Status: DC

## 2017-06-29 MED ORDER — ALBUTEROL SULFATE HFA 108 (90 BASE) MCG/ACT IN AERS: 2.0000 | INHALATION_SPRAY | Freq: Four times a day (QID) | RESPIRATORY_TRACT | 2 refills | Status: DC | PRN

## 2017-06-29 NOTE — Patient Instructions (Addendum)
I have refilled you medications today. As always nice to see you.  Follow up every 6 months sooner if needed.

## 2017-06-29 NOTE — Progress Notes (Signed)
[name], female   DOB: 1976/07/17, 41 y.o.   MRN: [id number]   Subjective:    [name], female    DOB: [birth year], 41 y.o.   MRN: [id number]  Chief Complaint  Patient presents with  . Anxiety    HPI  Generalized anxiety disorder:  Patient reports for follow-up on her anxiety disorder. She reports she is doing rather well on the Wellbutrin and Paxil. She very infrequently uses the Ativan, but does use at times when needed. She has suffered from panic attacks in the past, and reserves the Ativan use only during a potential panic attack. Prior note: Patient has a history of generalized anxiety disorder, and phobic disorder. She has been on Wellbutrin and Paxil continuously for the last 3 months. She is tolerating her medications well, without side effect. She feels her anxiety is well controlled on this regimen.  Patient also has been prescribed 1 mg of Ativan 2 times a day as needed, patient states she rarely needs this medication any longer,but has it in the event she has a panic attack. 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 Patient is in need of refills for her Wellbutrin and her Paxil today.  Depression screen [facility] 2/9 06/29/2017 07/07/2016  Decreased Interest 0 0  Down, Depressed, Hopeless 0 0  PHQ - 2 Score 0 0  Altered sleeping 0 -  Tired, decreased energy 0 -  Change in appetite 0 -  Feeling bad or failure about yourself  0 -  Trouble concentrating 0 -  Moving slowly or fidgety/restless 0 -  Suicidal thoughts 0 -  PHQ-9 Score 0 -    Past Medical History:  Diagnosis Date  . ALLERGIC RHINITIS 11/21/2010  . Allergy    seasonal  . ANXIETY DEPRESSION 11/21/2010  . Asthma   . Dehydration 06/14/2012  . Mastitis 06/14/2012  . Other specified anemias 11/21/2010  . Unspecified asthma(493.90) 03/18/2013   Allergies  Allergen Reactions  . Ciprofloxacin Other (See Comments)    GI upset    History reviewed. No pertinent surgical history. Family History  Problem Relation Age of Onset  . Hyperlipidemia Mother   . Osteoarthritis Mother   . Diabetes Father        Type 2  . Ulcers Father   . Migraines Sister   . Bipolar disorder Brother   . Panic disorder Brother   . Arthritis Maternal Grandmother   . Cancer Maternal Grandmother        uterine  . Cancer Maternal Grandfather 38       colon  . Heart disease Maternal Grandfather   . Heart attack Maternal Grandfather   . Diabetes Paternal Grandmother   . Hypertension Paternal Grandmother   . Cancer Paternal Grandfather        multiple myeloma  . Breast cancer Maternal Aunt   . Anesthesia problems Neg Hx   . Hypotension Neg Hx   . Malignant hyperthermia Neg Hx   . Pseudochol deficiency Neg Hx    Social History   Social History  . Marital status: Married    Spouse name: N/A  . Number of children: N/A  . Years of education: N/A   Occupational History  . Not on file.   Social History Main Topics  . Smoking status: Never Smoker  . Smokeless tobacco: Never Used  . Alcohol use 0.0 oz/week    0 drink(s) per week  Comment: rare occasional glass of wine  . Drug use: No  . Sexual activity: Yes   Other Topics Concern  . Not on file   Social History Narrative  . No narrative on file     Review of Systems Negative, with the exception of above mentioned in HPI     Objective:   Physical Exam BP 115/77 (BP Location: Right Arm, Patient Position: Sitting, Cuff Size: Normal)   Pulse 98   Temp 98.3 F (36.8 C)   Resp 20   Wt 156 lb 8 oz (71 kg)   SpO2 98%   BMI 23.11 kg/m  Gen: Afebrile. No acute distress. Very pleasant, Caucasian female. HENT: AT. [place].  MMM.  Eyes:Pupils Equal Round Reactive to light, Extraocular movements intact,  Conjunctiva without redness, discharge or icterus. CV: RRR, no edema Chest: CTAB, no wheeze or crackles Neuro:  Normal gait. PERLA. EOMi. Alert. Oriented x3  Psych:  Normal affect, dress and demeanor. Normal speech. Normal thought content and judgment.     Assessment & Plan:  [name] is a 41 y.o. present for 6  month follow up on  Phobic anxiety disorder/generalized anxiety disorder: - Stable today continue Paxil 30 mg daily and Wellbutrin 150 mg daily, refills have been called in for 6 months to her pharmacy.  - Refills on Ativan were provided today. Patient will follow every 6 months in person  if needing this medication refilled. She is aware this is a controlled substance. She uses this very rarely. [place] controlled substance database reviewed, is appropriate, and a part of her permanent chart today. - F/U 6 months.   Electronically Signed by: Howard Pouch, DO [place] [facility] [place]

## 2017-07-02 ENCOUNTER — Other Ambulatory Visit: Payer: Self-pay | Admitting: *Deleted

## 2017-07-02 DIAGNOSIS — F4010 Social phobia, unspecified: Secondary | ICD-10-CM

## 2017-07-02 NOTE — Telephone Encounter (Signed)
Pt called stating that Rx for Lorazepam was sent to mail order for only #45 tablets and the mail order pharmacy denied the refill because it wasn't a true 90 day supply. Pt stated that she is okay with just a 30 day supply being sent to [facility] if that is what Dr. Raoul Pitch wants to send in or if you would like to change the mail order script she would be okay with that too. Please advise. Thanks.

## 2017-07-05 ENCOUNTER — Telehealth: Payer: Self-pay | Admitting: *Deleted

## 2017-07-05 DIAGNOSIS — F4010 Social phobia, unspecified: Secondary | ICD-10-CM

## 2017-07-05 MED ORDER — LORAZEPAM 1 MG PO TABS: 1.0000 mg | ORAL_TABLET | Freq: Two times a day (BID) | ORAL | 1 refills | Status: DC | PRN

## 2017-07-05 MED ORDER — LORAZEPAM 1 MG PO TABS: 1.0000 mg | ORAL_TABLET | Freq: Two times a day (BID) | ORAL | 5 refills | Status: DC | PRN

## 2017-07-05 NOTE — Telephone Encounter (Signed)
Sent to local pharmacy.

## 2017-07-05 NOTE — Telephone Encounter (Signed)
[place] new script

## 2017-07-05 NOTE — Telephone Encounter (Signed)
Received fax stating sublingual tablets are not available for Ativan  They are requesting a new Rx for this medication.Please advise

## 2017-07-07 ENCOUNTER — Other Ambulatory Visit: Payer: Self-pay | Admitting: Family Medicine

## 2017-07-07 DIAGNOSIS — Z1231 Encounter for screening mammogram for malignant neoplasm of breast: Secondary | ICD-10-CM

## 2017-09-17 ENCOUNTER — Ambulatory Visit (INDEPENDENT_AMBULATORY_CARE_PROVIDER_SITE_OTHER): Payer: 59

## 2017-09-17 DIAGNOSIS — Z23 Encounter for immunization: Secondary | ICD-10-CM

## 2017-09-20 DIAGNOSIS — Z23 Encounter for immunization: Secondary | ICD-10-CM | POA: Diagnosis not present

## 2017-11-06 ENCOUNTER — Ambulatory Visit (HOSPITAL_BASED_OUTPATIENT_CLINIC_OR_DEPARTMENT_OTHER)
Admission: RE | Admit: 2017-11-06 | Discharge: 2017-11-06 | Disposition: A | Discharge status: Home / Self Care | Payer: 59 | Source: Ambulatory Visit | Attending: Family Medicine | Admitting: Family Medicine

## 2017-11-06 DIAGNOSIS — Z1231 Encounter for screening mammogram for malignant neoplasm of breast: Secondary | ICD-10-CM | POA: Insufficient documentation

## 2017-12-10 ENCOUNTER — Other Ambulatory Visit: Payer: Self-pay | Admitting: Family Medicine

## 2018-03-28 ENCOUNTER — Other Ambulatory Visit: Payer: Self-pay | Admitting: Family Medicine

## 2018-03-28 DIAGNOSIS — F411 Generalized anxiety disorder: Secondary | ICD-10-CM

## 2018-06-10 ENCOUNTER — Other Ambulatory Visit: Payer: Self-pay | Admitting: Family Medicine

## 2018-09-08 ENCOUNTER — Other Ambulatory Visit: Payer: Self-pay | Admitting: Family Medicine

## 2018-09-08 NOTE — Telephone Encounter (Signed)
Left message for patient to schedule an appt

## 2018-09-08 NOTE — Telephone Encounter (Signed)
Refill request received for [name] on her paxil. Refilled for 90 days, but let her know to schedule an appt since it has been >14 months since her last visit. Thanks. - if everything is going well, no acute issues, this can be her physical if she wants.

## 2018-10-04 ENCOUNTER — Encounter: Payer: Self-pay | Admitting: Family Medicine

## 2018-10-04 ENCOUNTER — Ambulatory Visit (INDEPENDENT_AMBULATORY_CARE_PROVIDER_SITE_OTHER): Payer: 59 | Admitting: Family Medicine

## 2018-10-04 VITALS — BP 116/79 | HR 76 | Temp 98.2°F | Resp 20 | Ht 69.0 in | Wt 163.8 lb

## 2018-10-04 DIAGNOSIS — Z23 Encounter for immunization: Secondary | ICD-10-CM

## 2018-10-04 DIAGNOSIS — Z1239 Encounter for other screening for malignant neoplasm of breast: Secondary | ICD-10-CM | POA: Diagnosis not present

## 2018-10-04 DIAGNOSIS — Z13220 Encounter for screening for lipoid disorders: Secondary | ICD-10-CM | POA: Diagnosis not present

## 2018-10-04 DIAGNOSIS — F411 Generalized anxiety disorder: Secondary | ICD-10-CM | POA: Diagnosis not present

## 2018-10-04 DIAGNOSIS — Z0000 Encounter for general adult medical examination without abnormal findings: Secondary | ICD-10-CM | POA: Diagnosis not present

## 2018-10-04 DIAGNOSIS — J4520 Mild intermittent asthma, uncomplicated: Secondary | ICD-10-CM

## 2018-10-04 DIAGNOSIS — Z131 Encounter for screening for diabetes mellitus: Secondary | ICD-10-CM | POA: Diagnosis not present

## 2018-10-04 DIAGNOSIS — E559 Vitamin D deficiency, unspecified: Secondary | ICD-10-CM

## 2018-10-04 LAB — COMPREHENSIVE METABOLIC PANEL
ALBUMIN: 4.4 g/dL (ref 3.5–5.2)
ALK PHOS: 38 U/L — AB (ref 39–117)
ALT: 7 U/L (ref 0–35)
AST: 13 U/L (ref 0–37)
BILIRUBIN TOTAL: 0.5 mg/dL (ref 0.2–1.2)
BUN: 12 mg/dL (ref 6–23)
CALCIUM: 9.3 mg/dL (ref 8.4–10.5)
CO2: 25 mEq/L (ref 19–32)
Chloride: 106 mEq/L (ref 96–112)
Creatinine, Ser: 1.06 mg/dL (ref 0.40–1.20)
GFR: 59.92 mL/min — ABNORMAL LOW (ref >60.00)
Glucose, Bld: 97 mg/dL (ref 70–99)
Potassium: 4.3 mEq/L (ref 3.5–5.1)
Sodium: 139 mEq/L (ref 135–145)
Total Protein: 6.9 g/dL (ref 6.0–8.3)

## 2018-10-04 LAB — CBC WITH DIFFERENTIAL/PLATELET
BASOS ABS: 0 10*3/uL (ref 0.0–0.1)
BASOS PCT: 0.7 % (ref 0.0–3.0)
EOS ABS: 0.1 10*3/uL (ref 0.0–0.7)
Eosinophils Relative: 3 % (ref 0.0–5.0)
HCT: 40.7 % (ref 36.0–46.0)
Hemoglobin: 13.8 g/dL (ref 12.0–15.0)
LYMPHS ABS: 1.2 10*3/uL (ref 0.7–4.0)
Lymphocytes Relative: 28.2 % (ref 12.0–46.0)
MCHC: 33.9 g/dL (ref 30.0–36.0)
MCV: 85.6 fl (ref 78.0–100.0)
MONO ABS: 0.3 10*3/uL (ref 0.1–1.0)
Monocytes Relative: 7.4 % (ref 3.0–12.0)
NEUTROS ABS: 2.6 10*3/uL (ref 1.4–7.7)
NEUTROS PCT: 60.7 % (ref 43.0–77.0)
PLATELETS: 176 10*3/uL (ref 150.0–400.0)
RBC: 4.76 Mil/uL (ref 3.87–5.11)
RDW: 15.3 % (ref 11.5–15.5)
WBC: 4.3 10*3/uL (ref 4.0–10.5)

## 2018-10-04 LAB — LIPID PANEL
CHOLESTEROL: 224 mg/dL — AB (ref 0–200)
HDL: 67.9 mg/dL (ref >39.00)
LDL Cholesterol: 134 mg/dL — ABNORMAL HIGH (ref 0–99)
NonHDL: 155.71
TRIGLYCERIDES: 111 mg/dL (ref 0.0–149.0)
Total CHOL/HDL Ratio: 3
VLDL: 22.2 mg/dL (ref 0.0–40.0)

## 2018-10-04 LAB — HEMOGLOBIN A1C: Hgb A1c MFr Bld: 5.2 % (ref 4.6–6.5)

## 2018-10-04 LAB — TSH: TSH: 0.92 u[IU]/mL (ref 0.35–4.50)

## 2018-10-04 LAB — VITAMIN D 25 HYDROXY (VIT D DEFICIENCY, FRACTURES): VITD: 29.32 ng/mL — ABNORMAL LOW (ref 30.00–100.00)

## 2018-10-04 MED ORDER — BUPROPION HCL 100 MG PO TABS: ORAL_TABLET | ORAL | 3 refills | Status: DC

## 2018-10-04 MED ORDER — PAROXETINE HCL 30 MG PO TABS: 30.0000 mg | ORAL_TABLET | Freq: Every day | ORAL | 3 refills | Status: DC

## 2018-10-04 MED ORDER — ALBUTEROL SULFATE HFA 108 (90 BASE) MCG/ACT IN AERS: 2.0000 | INHALATION_SPRAY | Freq: Four times a day (QID) | RESPIRATORY_TRACT | 2 refills | Status: DC | PRN

## 2018-10-04 MED FILL — VENTOLIN HFA 90 MCG INHALER: 108 (90 BAS | 25 days supply | Qty: 18 | Fill #0

## 2018-10-04 NOTE — Progress Notes (Addendum)
[name], female  DOB: [birth year], 42 y.o.   MRN: [id number] Patient Care Team    Relationship Specialty Notifications Start End  Ma Hillock, DO PCP - General Family Medicine  08/28/15     Chief Complaint  Patient presents with  . Annual Exam    Subjective:  [name] is a 42 y.o.  Female  present for CPE. All past medical history, surgical history, allergies, family history, immunizations, medications and social history were updated in the electronic medical record today. All recent labs, ED visits and hospitalizations within the last year were reviewed.  Health maintenance: updated 10/04/18 Colonoscopy: MGF colon cancer (78s). Screen at 50. Mammogram: Fhx breast cancer m[place]unt 11/06/2017 bi rads 1. [facility] Cervical cancer screening: last 07/2016; normal/neg hpv completed here.F/U 3-5 years. Immunizations: tdap 2013, Influenza completed today (encouraged yearly) Infectious disease screening: HIV  2013 DEXA:mother severe osteoporosis, routine screen Assistive device: none Oxygen use: none Patient has a Dental home. Hospitalizations/ED visits: reviewed  GAD: Pt reports she is doing well. Compliance with Wellbutrin and paxil. Very infrequently takes ativan.   Depression screen [facility] 2/9 10/04/2018 06/29/2017 07/07/2016  Decreased Interest 0 0 0  Down, Depressed, Hopeless 0 0 0  PHQ - 2 Score 0 0 0  Altered sleeping 0 0 -  Tired, decreased energy 0 0 -  Change in appetite 0 0 -  Feeling bad or failure about yourself  0 0 -  Trouble concentrating 0 0 -  Moving slowly or fidgety/restless 0 0 -  Suicidal thoughts 0 0 -  PHQ-9 Score 0 0 -  Difficult doing work/chores Not difficult at all - -   GAD 7 : Generalized Anxiety Score 10/04/2018  Nervous, Anxious, on Edge 0  Control/stop worrying 0  Worry too much - different things 0  Trouble relaxing 0  Restless 0  Easily annoyed or irritable 0  Afraid - awful might happen 0  Total GAD 7  Score 0  Anxiety Difficulty Not difficult at all     Immunization History  Administered Date(s) Administered  . Influenza Split 09/10/2011, 09/14/2012  . Influenza,inj,Quad PF,6+ Mos 08/23/2013, 10/02/2014, 08/28/2015, 09/03/2016, 09/20/2017, 10/04/2018  . Tdap 03/05/2012     Past Medical History:  Diagnosis Date  . ALLERGIC RHINITIS 11/21/2010  . Allergy    seasonal  . ANXIETY DEPRESSION 11/21/2010  . Asthma   . Dehydration 06/14/2012  . Mastitis 06/14/2012  . Other specified anemias 11/21/2010  . Unspecified asthma(493.90) 03/18/2013   Allergies  Allergen Reactions  . Ciprofloxacin Other (See Comments)    GI upset   History reviewed. No pertinent surgical history. Family History  Problem Relation Age of Onset  . Hyperlipidemia Mother   . Osteoarthritis Mother   . Diabetes Father        Type 2  . Ulcers Father   . Migraines Sister   . Bipolar disorder Brother   . Panic disorder Brother   . Arthritis Maternal Grandmother   . Cancer Maternal Grandmother        uterine  . Cancer Maternal Grandfather 25       colon  . Heart disease Maternal Grandfather   . Heart attack Maternal Grandfather   . Diabetes Paternal Grandmother   . Hypertension Paternal Grandmother   . Cancer Paternal Grandfather        multiple myeloma  . Breast cancer Maternal Aunt   . Anesthesia problems Neg Hx   . Hypotension Neg  Hx   . Malignant hyperthermia Neg Hx   . Pseudochol deficiency Neg Hx    Social History   Socioeconomic History  . Marital status: Married    Spouse name: Not on file  . Number of children: Not on file  . Years of education: Not on file  . Highest education level: Not on file  Occupational History  . Not on file  Social Needs  . Financial resource strain: Not on file  . Food insecurity:    Worry: Not on file    Inability: Not on file  . Transportation needs:    Medical: Not on file    Non-medical: Not on file  Tobacco Use  . Smoking status: Never Smoker  .  Smokeless tobacco: Never Used  Substance and Sexual Activity  . Alcohol use: Yes    Alcohol/week: 0.0 standard drinks    Comment: rare occasional glass of wine  . Drug use: No  . Sexual activity: Yes  Lifestyle  . Physical activity:    Days per week: Not on file    Minutes per session: Not on file  . Stress: Not on file  Relationships  . Social connections:    Talks on phone: Not on file    Gets together: Not on file    Attends religious service: Not on file    Active member of club or organization: Not on file    Attends meetings of clubs or organizations: Not on file    Relationship status: Not on file  . Intimate partner violence:    Fear of current or ex partner: Not on file    Emotionally abused: Not on file    Physically abused: Not on file    Forced sexual activity: Not on file  Other Topics Concern  . Not on file  Social History Narrative  . Not on file   Allergies as of 10/04/2018      Reactions   Ciprofloxacin Other (See Comments)   GI upset      Medication List        Accurate as of 10/04/18 10:42 AM. Always use your most recent med list.          albuterol 108 (90 Base) MCG/ACT inhaler Commonly known as:  PROVENTIL HFA;VENTOLIN HFA Inhale 2 puffs into the lungs every 6 (six) hours as needed. For wheezing   buPROPion 100 MG tablet Commonly known as:  WELLBUTRIN TAKE 1 AND 1/2 TABLETS (150 MG TOTAL) BY MOUTH DAILY.   fluticasone 50 MCG/ACT nasal spray Commonly known as:  FLONASE PLACE 2 SPRAYS INTO BOTH NOSTRILS DAILY.   LORazepam 1 MG tablet Commonly known as:  ATIVAN Take 1 tablet (1 mg total) by mouth 2 (two) times daily as needed for anxiety.   multivitamin tablet Take 1 tablet by mouth daily.   PARoxetine 30 MG tablet Commonly known as:  PAXIL Take 1 tablet (30 mg total) by mouth daily.       All past medical history, surgical history, allergies, family history, immunizations andmedications were updated in the EMR today and reviewed  under the history and medication portions of their EMR.     No results found for this or any previous visit (from the past 2160 hour(s)).  Mm Screening Breast Tomo Bilateral  Result Date: 11/08/2017 CLINICAL DATA:  Screening. EXAM: 2D DIGITAL SCREENING BILATERAL MAMMOGRAM WITH CAD AND ADJUNCT TOMO COMPARISON:  Previous exam(s). ACR Breast Density Category b: There are scattered areas of fibroglandular density. FINDINGS:  There are no findings suspicious for malignancy. Images were processed with CAD. IMPRESSION: No mammographic evidence of malignancy. A result letter of this screening mammogram will be mailed directly to the patient. RECOMMENDATION: Screening mammogram in one year. (Code:SM-B-01Y) BI-RADS CATEGORY  1: Negative. Electronically Signed   By: Margarette Canada M.D.   On: 11/08/2017 09:07    ROS: 14 pt review of systems performed and negative (unless mentioned in an HPI)  Objective: BP 116/79 (BP Location: Right Arm, Patient Position: Sitting, Cuff Size: Normal)   Pulse 76   Temp 98.2 F (36.8 C)   Resp 20   Ht 5' 9" (1.753 m)   Wt 163 lb 12.8 oz (74.3 kg)   LMP 09/21/2018   SpO2 98%   BMI 24.19 kg/m  Gen: Afebrile. No acute distress. Nontoxic in appearance, well-developed, well-nourished,  Pleasant caucasian female.  HENT: AT. [place]. Bilateral TM visualized and normal in appearance, normal external auditory canal. MMM, no oral lesions, adequate dentition. Bilateral nares within normal limits. Throat without erythema, ulcerations or exudates. no Cough on exam, no hoarseness on exam. Eyes:Pupils Equal Round Reactive to light, Extraocular movements intact,  Conjunctiva without redness, discharge or icterus. Neck/lymp/endocrine: Supple,no lymphadenopathy, no thyromegaly CV: RRR no murmur, no edema, +2/4 P posterior tibialis pulses. no carotid bruits. No JVD. Chest: CTAB, no wheeze, rhonchi or crackles. normal Respiratory effort. good Air movement. Abd: Soft. flat. NTND. BS present. no  Masses palpated. No hepatosplenomegaly. No rebound tenderness or guarding. Skin: no rashes, purpura or petechiae. Warm and well-perfused. Skin intact. Neuro/Msk:  Normal gait. PERLA. EOMi. Alert. Oriented x3.  Cranial nerves II through XII intact. Muscle strength 5/5 upper/lower extremity. DTRs equal bilaterally. Psych: Normal affect, dress and demeanor. Normal speech. Normal thought content and judgment.  No exam data present  Assessment/plan: ROMAINE MACIOLEK is a 42 y.o. female present for CPE. Immunization due - Flu Vaccine QUAD 6+ mos PF IM (Fluarix Quad PF) Generalized anxiety disorder - stable.  - CBC w/Diff - Comp Met (CMET) - buPROPion (WELLBUTRIN) 100 MG tablet; TAKE 1 AND 1/2 TABLETS (150 MG TOTAL) BY MOUTH DAILY.  Dispense: 135 tablet; Refill: 3 - PARoxetine (PAXIL) 30 MG tablet; Take 1 tablet (30 mg total) by mouth daily.  Dispense: 90 tablet; Refill: 3 - TSH - f/u yearly Asthma:  Albuterol inhaler prescribed.  Lipid screening - Lipid panel Screening for diabetes mellitus - HgB A1c Hypocalcemia/Vitamin D deficiency - Comp Met (CMET)/Vit D - currently not on supplement.  Encounter for preventive health examination Patient was encouraged to exercise greater than 150 minutes a week. Patient was encouraged to choose a diet filled with fresh fruits and vegetables, and lean meats. AVS provided to patient today for education/recommendation on gender specific health and safety maintenance. Colonoscopy: MGF colon cancer (27s). Screen at 50. Mammogram: Fhx breast cancer mataunt 11/06/2017 bi rads 1. [facility](ordered) Cervical cancer screening: last 07/2016; normal/neg hpv completed here.F/U 3-5 years. Immunizations: tdap 2013, Influenza completed today (encouraged yearly) Infectious disease screening: HIV  2013 DEXA:mother severe osteoporosis, routine screen   Return in about 1 year (around 10/05/2019) for CPE. Unless needed sooner.  Electronically signed by: Howard Pouch, DO [place]

## 2018-10-04 NOTE — Patient Instructions (Addendum)

## 2018-10-05 ENCOUNTER — Telehealth: Payer: Self-pay | Admitting: Family Medicine

## 2018-10-05 DIAGNOSIS — R944 Abnormal results of kidney function studies: Secondary | ICD-10-CM

## 2018-10-05 NOTE — Telephone Encounter (Signed)
**   Please inform patient the following information: - Thyroid and A1c (diabetes screen) are normal.  - her vit d is just mildly below normal at 29 (30 normal) and her calcium is now normal--> recommend either a daily womens vitamin that has vit d in it, 400 u daily supplement or increase her dietary consumption of vit d rich foods.  - CBC- blood counts are good.  - her cholesterol is higher this year, but still ok at bad cholesterol 134 (was 95 last year). - CMP- Her kidney function is very mildly reduced below "normal", normal > 60 and her level is 59.92 (typically she is 75-89). She has fhx of multiple myeloma, so we should not completely ignore the change even if very mild- especially since she personally has no risk factors to cause this change.  Could just be a fluke, dehydration etc, but definitely want to make sure it is not trending down and need further work up. Suggest: avoiding all routine use of nsaids- if she does use them (advil, naproxen, motrin) and repeat bmp test in 4-6 weeks with a urinalysis to recheck--> can be lab appt only. Orders placed.

## 2018-10-05 NOTE — Telephone Encounter (Signed)
Spoke with patient reviewed lab results and instructions. Patient verbalized understanding. Patient will call back to schedule lab appt.

## 2018-10-12 ENCOUNTER — Telehealth: Payer: Self-pay | Admitting: Family Medicine

## 2018-10-12 MED ORDER — NEOMYCIN-POLYMYXIN-HC 3.5-10000-1 OT SUSP: OTIC | 0 refills | Status: DC

## 2018-10-12 NOTE — Telephone Encounter (Signed)
Pt called in with otitis externa infection. Cortisporin otic suspension  prescribed [facility]

## 2018-11-30 ENCOUNTER — Other Ambulatory Visit (INDEPENDENT_AMBULATORY_CARE_PROVIDER_SITE_OTHER): Payer: 59

## 2018-11-30 DIAGNOSIS — R944 Abnormal results of kidney function studies: Secondary | ICD-10-CM

## 2018-11-30 NOTE — Addendum Note (Signed)
Addended by: Ralph Dowdy on: 11/30/2018 03:08 PM   Modules accepted: Orders

## 2018-12-01 LAB — URINALYSIS, ROUTINE W REFLEX MICROSCOPIC
BACTERIA UA: NONE SEEN /HPF
Bilirubin Urine: NEGATIVE
Glucose, UA: NEGATIVE
Hyaline Cast: NONE SEEN /LPF
Ketones, ur: NEGATIVE
Nitrite: NEGATIVE
PROTEIN: NEGATIVE
SQUAMOUS EPITHELIAL / LPF: NONE SEEN /HPF (ref <=5)
Specific Gravity, Urine: 1.004 (ref 1.001–1.03)
pH: 7 (ref 5.0–8.0)

## 2018-12-01 LAB — BASIC METABOLIC PANEL
BUN: 11 mg/dL (ref 7–25)
CHLORIDE: 103 mmol/L (ref 98–110)
CO2: 25 mmol/L (ref 20–32)
CREATININE: 1.03 mg/dL (ref 0.50–1.10)
Calcium: 8.8 mg/dL (ref 8.6–10.2)
Glucose, Bld: 91 mg/dL (ref 65–99)
Potassium: 4.1 mmol/L (ref 3.5–5.3)
SODIUM: 135 mmol/L (ref 135–146)

## 2018-12-01 LAB — MICROALBUMIN / CREATININE URINE RATIO
Creatinine, Urine: 11 mg/dL — ABNORMAL LOW (ref 20–275)
Microalb, Ur: <0.2 mg/dL

## 2018-12-27 ENCOUNTER — Ambulatory Visit (HOSPITAL_BASED_OUTPATIENT_CLINIC_OR_DEPARTMENT_OTHER)
Admission: RE | Admit: 2018-12-27 | Discharge: 2018-12-27 | Disposition: A | Discharge status: Home / Self Care | Payer: 59 | Source: Ambulatory Visit | Attending: Family Medicine | Admitting: Family Medicine

## 2018-12-27 DIAGNOSIS — Z1239 Encounter for other screening for malignant neoplasm of breast: Secondary | ICD-10-CM | POA: Insufficient documentation

## 2018-12-27 DIAGNOSIS — Z1231 Encounter for screening mammogram for malignant neoplasm of breast: Secondary | ICD-10-CM | POA: Diagnosis not present

## 2018-12-28 ENCOUNTER — Other Ambulatory Visit: Payer: Self-pay | Admitting: Family Medicine

## 2018-12-28 DIAGNOSIS — R928 Other abnormal and inconclusive findings on diagnostic imaging of breast: Secondary | ICD-10-CM

## 2019-01-04 ENCOUNTER — Ambulatory Visit
Admission: RE | Admit: 2019-01-04 | Discharge: 2019-01-04 | Disposition: A | Discharge status: Home / Self Care | Payer: 59 | Source: Ambulatory Visit | Attending: Family Medicine | Admitting: Family Medicine

## 2019-01-04 ENCOUNTER — Ambulatory Visit: Payer: 59

## 2019-01-04 DIAGNOSIS — R922 Inconclusive mammogram: Secondary | ICD-10-CM | POA: Diagnosis not present

## 2019-01-04 DIAGNOSIS — R928 Other abnormal and inconclusive findings on diagnostic imaging of breast: Secondary | ICD-10-CM

## 2019-01-10 ENCOUNTER — Other Ambulatory Visit: Payer: 59

## 2019-02-24 ENCOUNTER — Other Ambulatory Visit: Payer: Self-pay | Admitting: Family Medicine

## 2019-05-29 ENCOUNTER — Ambulatory Visit: Payer: 59 | Admitting: Family Medicine

## 2019-05-29 ENCOUNTER — Other Ambulatory Visit: Payer: Self-pay

## 2019-05-29 ENCOUNTER — Encounter: Payer: Self-pay | Admitting: Family Medicine

## 2019-05-29 VITALS — BP 100/67 | HR 75 | Temp 97.7°F | Resp 18 | Ht 69.0 in | Wt 162.5 lb

## 2019-05-29 DIAGNOSIS — F411 Generalized anxiety disorder: Secondary | ICD-10-CM

## 2019-05-29 MED ORDER — LORAZEPAM 1 MG PO TABS: 1.0000 mg | ORAL_TABLET | Freq: Two times a day (BID) | ORAL | 1 refills | Status: DC | PRN

## 2019-05-29 MED FILL — LORazepam 1 MG TABS: 1 | 45 days supply | Qty: 90 | Fill #0

## 2019-05-29 NOTE — Patient Instructions (Signed)
Great to see you today.  Stay safe!!!

## 2019-05-29 NOTE — Progress Notes (Signed)
[name], female   DOB: 1976-01-13, 43 y.o.   MRN: [id number]   Subjective:    [name], female    DOB: 10-Oct-1976, 43 y.o.   MRN: [id number]  Chief Complaint  Patient presents with  . Anxiety    No compaints. Needs refills on medicaitons     Anxiety    Generalized anxiety disorder:  Patient reports for follow-up on her anxiety disorder. She reports she is doing well  on the Wellbutrin and Paxil. She very infrequently uses the Ativan, but has been using more since coronavirus pandemic. She feels her anxiety has increased. She has suffered from panic attacks in the past, and reserves the Ativan use only during a potential panic attack. Prior note: Patient has a history of generalized anxiety disorder, and phobic disorder. She has been on Wellbutrin and Paxil continuously for the last 3 months. She is tolerating her medications well, without side effect. She feels her anxiety is well controlled on this regimen.  Patient also has been prescribed 1 mg of Ativan 2 times a day as needed, patient states she rarely needs this medication any longer,but has it in the event she has a panic attack. 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 Patient is in need of refills for her Wellbutrin and her Paxil today.  Depression screen [facility] 2/9 10/04/2018 06/29/2017 07/07/2016  Decreased Interest 0 0 0  Down, Depressed, Hopeless 0 0 0  PHQ - 2 Score 0 0 0  Altered sleeping 0 0 -  Tired, decreased energy 0 0 -  Change in appetite 0 0 -  Feeling bad or failure about yourself  0 0 -  Trouble concentrating 0 0 -  Moving slowly or fidgety/restless 0 0 -  Suicidal thoughts 0 0 -  PHQ-9 Score 0 0 -  Difficult doing work/chores Not difficult at all - -   GAD 7 : Generalized Anxiety Score 05/29/2019 10/04/2018  Nervous, Anxious, on Edge 1 0  Control/stop worrying 0 0  Worry too much - different things 0 0   Trouble relaxing 0 0  Restless 0 0  Easily annoyed or irritable 0 0  Afraid - awful might happen 0 0  Total GAD 7 Score 1 0  Anxiety Difficulty Not difficult at all Not difficult at all    Past Medical History:  Diagnosis Date  . ALLERGIC RHINITIS 11/21/2010  . Allergy    seasonal  . ANXIETY DEPRESSION 11/21/2010  . Asthma   . Dehydration 06/14/2012  . Mastitis 06/14/2012  . Other specified anemias 11/21/2010  . Unspecified asthma(493.90) 03/18/2013   Allergies  Allergen Reactions  . Ciprofloxacin Other (See Comments)    GI upset   No past surgical history on file. Family History  Problem Relation Age of Onset  . Hyperlipidemia Mother   . Osteoarthritis Mother   . Diabetes Father        Type 2  . Ulcers Father   . Migraines Sister   . Bipolar disorder Brother   . Panic disorder Brother   . Arthritis Maternal Grandmother   . Cancer Maternal Grandmother        uterine  . Cancer Maternal Grandfather 63       colon  . Heart disease Maternal Grandfather   . Heart attack Maternal Grandfather   . Diabetes Paternal Grandmother   . Hypertension Paternal Grandmother   . Cancer Paternal Grandfather  multiple myeloma  . Breast cancer Maternal Aunt   . Anesthesia problems Neg Hx   . Hypotension Neg Hx   . Malignant hyperthermia Neg Hx   . Pseudochol deficiency Neg Hx    Social History   Socioeconomic History  . Marital status: Married    Spouse name: Not on file  . Number of children: Not on file  . Years of education: Not on file  . Highest education level: Not on file  Occupational History  . Not on file  Social Needs  . Financial resource strain: Not on file  . Food insecurity    Worry: Not on file    Inability: Not on file  . Transportation needs    Medical: Not on file    Non-medical: Not on file  Tobacco Use  . Smoking status: Never Smoker  . Smokeless tobacco: Never Used  Substance and Sexual Activity  . Alcohol use: Yes    Alcohol/week: 0.0 standard  drinks    Comment: rare occasional glass of wine  . Drug use: No  . Sexual activity: Yes  Lifestyle  . Physical activity    Days per week: Not on file    Minutes per session: Not on file  . Stress: Not on file  Relationships  . Social Herbalist on phone: Not on file    Gets together: Not on file    Attends religious service: Not on file    Active member of club or organization: Not on file    Attends meetings of clubs or organizations: Not on file    Relationship status: Not on file  . Intimate partner violence    Fear of current or ex partner: Not on file    Emotionally abused: Not on file    Physically abused: Not on file    Forced sexual activity: Not on file  Other Topics Concern  . Not on file  Social History Narrative  . Not on file     Review of Systems Negative, with the exception of above mentioned in HPI     Objective:   Physical Exam BP 100/67 (BP Location: Right Arm, Patient Position: Sitting, Cuff Size: Normal)   Pulse 75   Temp 97.7 F (36.5 C) (Temporal)   Resp 18   Ht 5' 9"  (1.753 m)   Wt 162 lb 8 oz (73.7 kg)   LMP 05/28/2019 (Exact Date)   SpO2 98%   BMI 24.00 kg/m  Gen: Afebrile. No acute distress.  HENT: AT. [place].  MMM.  Eyes:Pupils Equal Round Reactive to light, Extraocular movements intact,  Conjunctiva without redness, discharge or icterus. CV: RRR Chest: CTAB, no wheeze or crackles Neuro: Normal gait. PERLA. EOMi. Alert. Oriented x3  Psych: Normal affect, dress and demeanor. Normal speech. Normal thought content and judgment.    Assessment & Plan:  [name] is a 43 y.o. present for 6  month follow up on  Phobic anxiety disorder/generalized anxiety disorder: - Stable today continue Paxil 30 mg daily and Wellbutrin 150 mg daily, refills have been called in for 6 months to her pharmacy.  - Refills on Ativan were provided 05/29/19 . Patient will follow every 6 months in person  if needing this medication refilled.  She is aware this is a controlled substance. She uses this very rarely. [place] controlled substance database reviewed, is appropriate, and a part of her permanent chart 05/29/19. - F/U 6 months.  > 15 minutes spent with  patient, > 50% of that time face to face   Electronically Signed by: Howard Pouch, DO [facility] [place]

## 2019-05-31 ENCOUNTER — Telehealth: Payer: Self-pay

## 2019-05-31 MED ORDER — LORAZEPAM 1 MG PO TABS: 1.0000 mg | ORAL_TABLET | Freq: Two times a day (BID) | ORAL | 1 refills | Status: DC | PRN

## 2019-05-31 NOTE — Telephone Encounter (Signed)
Pt would like Ativan RX sent to [facility] so it can be mailed to her home instead of [facility].   Please advise

## 2019-05-31 NOTE — Addendum Note (Signed)
Addended by: Howard Pouch A on: 05/31/2019 05:07 PM   Modules accepted: Orders

## 2019-05-31 NOTE — Telephone Encounter (Signed)
Please cancel Ativan  at [facility].  New script sent to [facility].

## 2019-06-01 ENCOUNTER — Telehealth: Payer: Self-pay | Admitting: Family Medicine

## 2019-06-01 MED ORDER — LORAZEPAM 1 MG PO TABS: 1.0000 mg | ORAL_TABLET | Freq: Two times a day (BID) | ORAL | 1 refills | Status: DC | PRN

## 2019-06-01 MED FILL — LORazepam 1 MG TABS: 1 | 45 days supply | Qty: 90 | Fill #0

## 2019-06-01 NOTE — Telephone Encounter (Signed)
[facility] received RX and will fill for patient

## 2019-06-01 NOTE — Telephone Encounter (Signed)
[facility] was called and RX for Ativan was cancelled.

## 2019-06-01 NOTE — Telephone Encounter (Signed)
Refilled script again- for [facility] pharmacy- please make sure it went through.

## 2019-07-05 ENCOUNTER — Other Ambulatory Visit: Payer: Self-pay | Admitting: Family Medicine

## 2019-08-07 IMAGING — MG DIGITAL SCREENING BILATERAL MAMMOGRAM WITH TOMO AND CAD
8 series · 8 of 24 positions shown · non-contrast
Comparison: Previous exam(s).

CLINICAL DATA: Screening.

EXAM:
DIGITAL SCREENING BILATERAL MAMMOGRAM WITH TOMO AND CAD

[R MLO synth-2D]
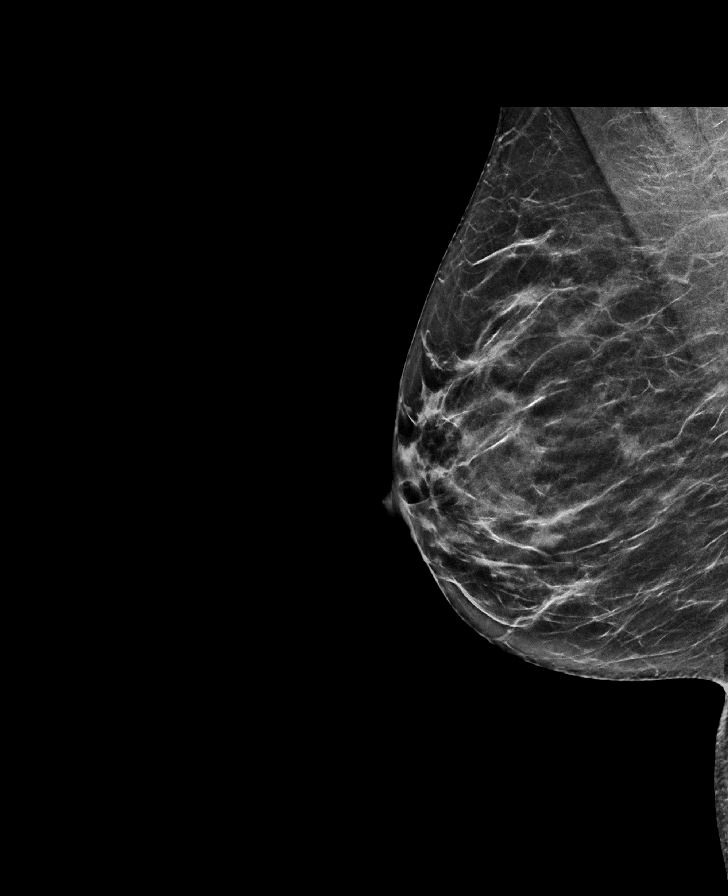

[L MLO synth-2D]
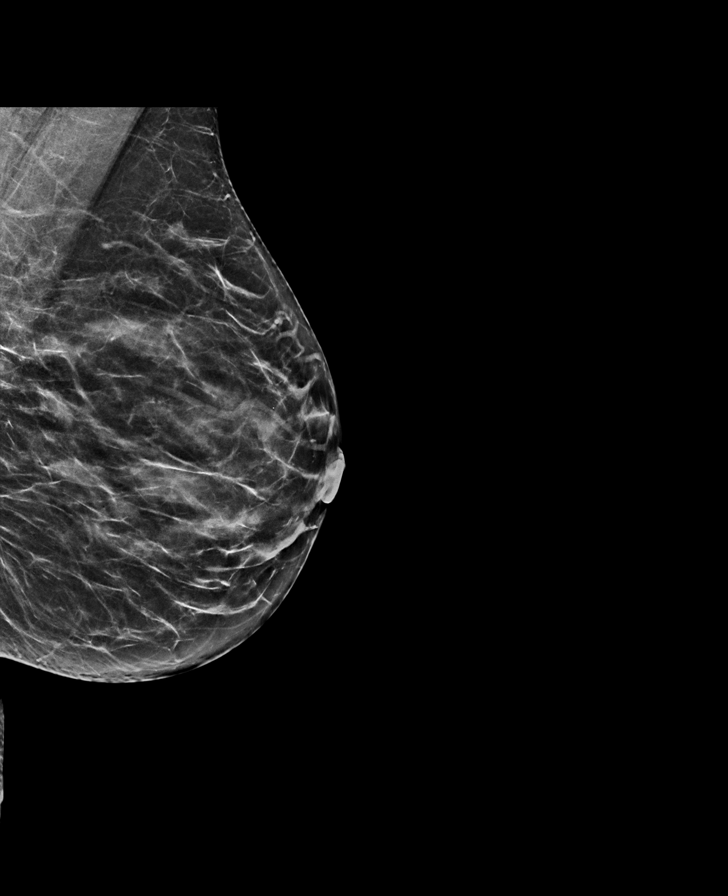

[L CC synth-2D]
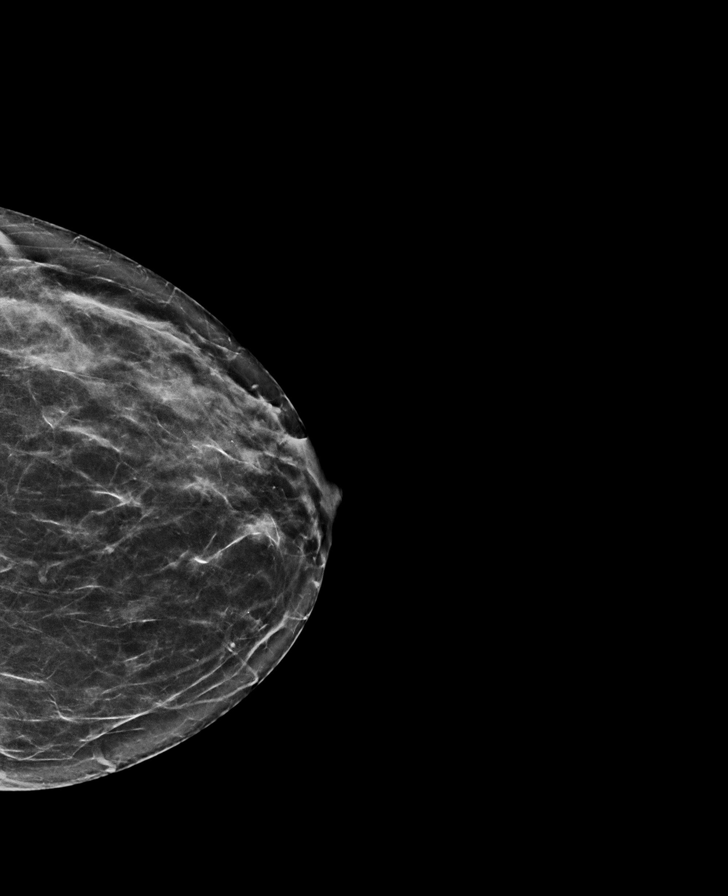

[R CC synth-2D]
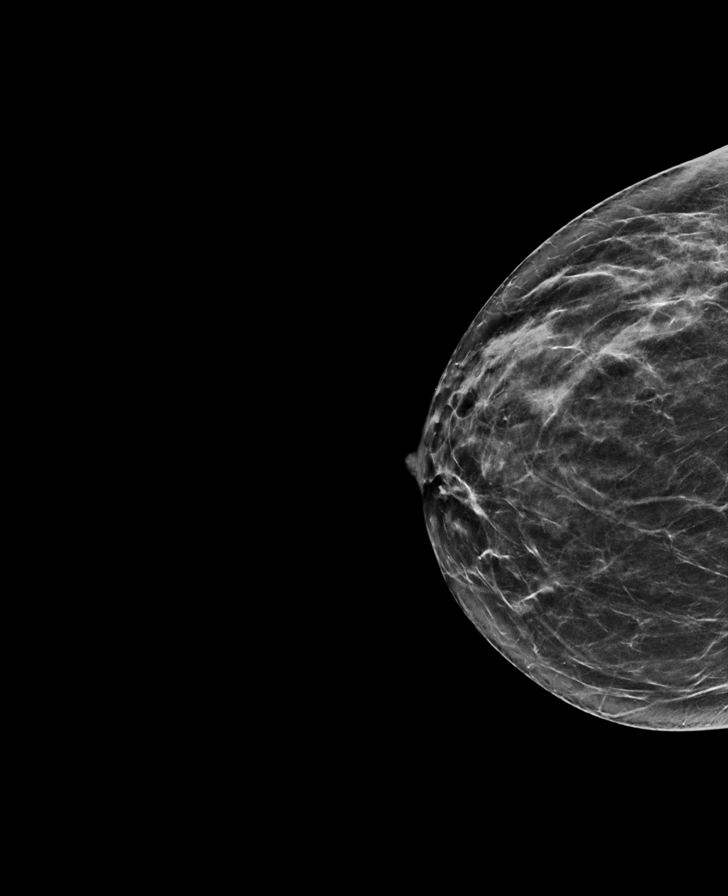

[R MLO tomo · tomo slice 31/62.0]
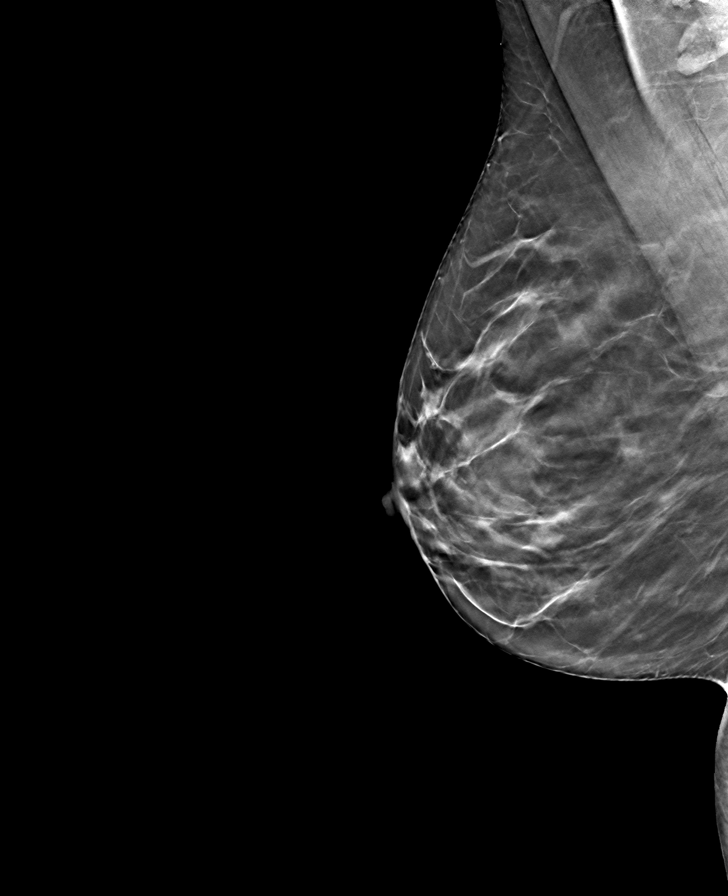

[R CC tomo · tomo slice 31/60.0]
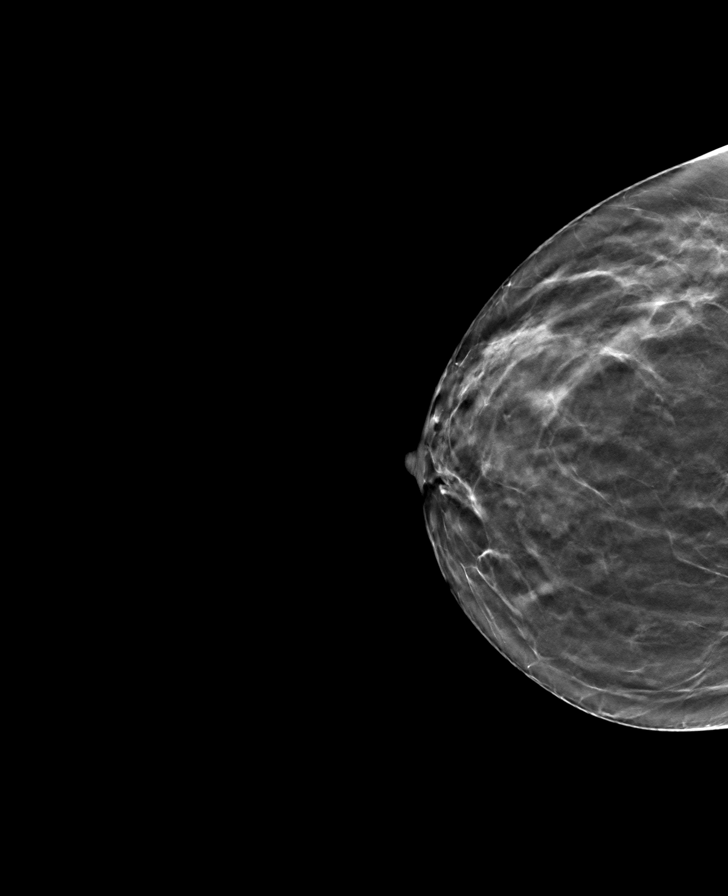

[L MLO tomo · tomo slice 30/59.0]
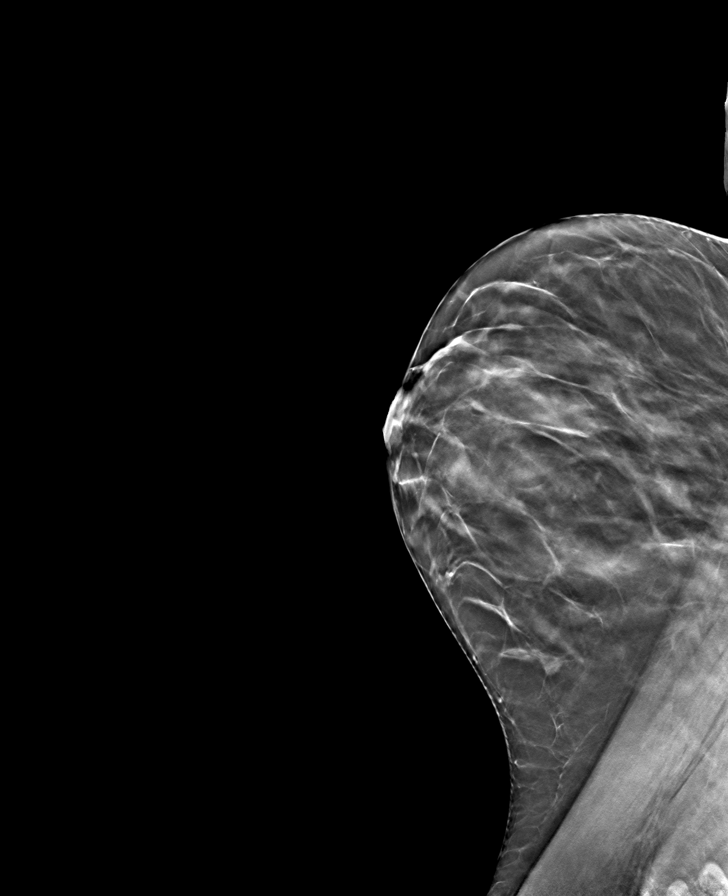

[L CC tomo · tomo slice 31/62.0]
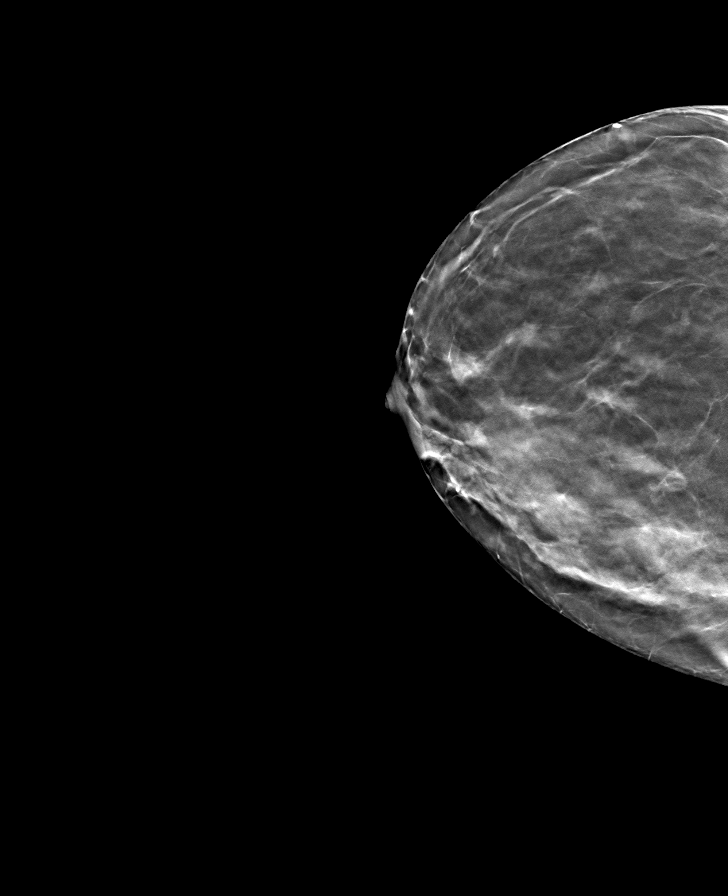

[8 of 24 positions shown; findings below may reference images not displayed]

ACR Breast Density Category c: The breast tissue is heterogeneously
dense, which may obscure small masses.
FINDINGS: In the right breast, a possible asymmetry warrants further
evaluation. In the left breast, no findings suspicious for
malignancy. Images were processed with CAD.
IMPRESSION: Further evaluation is suggested for possible asymmetry in the right
breast.

RECOMMENDATION:
Diagnostic mammogram and possibly ultrasound of the right breast.
(Code:EU-[id number])

The patient will be contacted regarding the findings, and additional
imaging will be scheduled.

BI-RADS CATEGORY  0: Incomplete. Need additional imaging evaluation
and/or prior mammograms for comparison.

## 2019-08-18 ENCOUNTER — Other Ambulatory Visit: Payer: Self-pay | Admitting: Family Medicine

## 2019-08-18 MED ORDER — ALBUTEROL SULFATE HFA 108 (90 BASE) MCG/ACT IN AERS: 2.0000 | INHALATION_SPRAY | Freq: Four times a day (QID) | RESPIRATORY_TRACT | 1 refills | Status: DC | PRN

## 2019-08-18 MED FILL — ALBUTEROL SULFATE HFA 108 (: 108 (90 BAS | 25 days supply | Qty: 18 | Fill #0

## 2019-08-18 NOTE — Telephone Encounter (Signed)
Patient requesting RF of albuterol inhaler.  Per [place] protocol, RX sent to pharmacy.

## 2019-09-13 ENCOUNTER — Ambulatory Visit (INDEPENDENT_AMBULATORY_CARE_PROVIDER_SITE_OTHER): Payer: 59

## 2019-09-13 ENCOUNTER — Other Ambulatory Visit: Payer: Self-pay

## 2019-09-13 DIAGNOSIS — Z23 Encounter for immunization: Secondary | ICD-10-CM

## 2019-09-23 ENCOUNTER — Other Ambulatory Visit: Payer: Self-pay | Admitting: Family Medicine

## 2019-10-04 ENCOUNTER — Telehealth: Payer: Self-pay | Admitting: Family Medicine

## 2019-10-04 DIAGNOSIS — F411 Generalized anxiety disorder: Secondary | ICD-10-CM

## 2019-10-04 MED ORDER — BUPROPION HCL 100 MG PO TABS: ORAL_TABLET | ORAL | 1 refills | Status: DC

## 2019-10-04 MED ORDER — PAROXETINE HCL 30 MG PO TABS: 30.0000 mg | ORAL_TABLET | Freq: Every day | ORAL | 3 refills | Status: DC

## 2019-10-04 NOTE — Telephone Encounter (Signed)
[facility] Pt made aware RX was sent to pharmacy

## 2019-10-04 NOTE — Telephone Encounter (Signed)
Caller: self Ph#: [phone]  Pt called for refill on buPROPion (WELLBUTRIN) 100 MG tablet. Pt has 2 tablets left.   [place], [place] - [place] [phone] (Phone) [phone] (Fax)

## 2019-10-04 NOTE — Telephone Encounter (Signed)
Please discontinue the medications of Wellbutrin and Paxil sent to the [facility].  Also sent to [facility].  Please let patient know her medications have been called in.

## 2019-10-04 NOTE — Telephone Encounter (Signed)
RF request for Wellbutrin 100mg   LOV: 05/29/2019 Next ov: Not scheduled  Last written: 10/04/2018 #135 x3 refills

## 2019-10-05 MED FILL — buPROPion HCL 100 MG TABS: 100 | 90 days supply | Qty: 135 | Fill #0

## 2019-10-05 NOTE — Telephone Encounter (Signed)
Dr Anitra Lauth notified

## 2019-10-14 ENCOUNTER — Other Ambulatory Visit: Payer: Self-pay | Admitting: Family Medicine

## 2019-10-14 DIAGNOSIS — F411 Generalized anxiety disorder: Secondary | ICD-10-CM

## 2019-10-17 NOTE — Telephone Encounter (Signed)
I believe this medication request is an error. Pt has medications refilled by [facility] now. Thre were refills provided 09/2019 to that pharmacy. Please make sure pt aware and [facility] is where she wants her meds still.

## 2019-10-20 NOTE — Telephone Encounter (Signed)
Pt was called and said she was going to use [facility] right now for medication since it was already sent to that pharmacy

## 2019-11-02 MED FILL — PARoxetine HCL 30 MG TABS: 30 | 90 days supply | Qty: 90 | Fill #0

## 2019-11-17 HISTORY — PX: BREAST BIOPSY: SHX20

## 2019-11-17 HISTORY — PX: BREAST EXCISIONAL BIOPSY: SUR124

## 2019-12-22 ENCOUNTER — Other Ambulatory Visit (HOSPITAL_BASED_OUTPATIENT_CLINIC_OR_DEPARTMENT_OTHER): Payer: Self-pay | Admitting: Family Medicine

## 2019-12-22 DIAGNOSIS — Z1231 Encounter for screening mammogram for malignant neoplasm of breast: Secondary | ICD-10-CM

## 2019-12-29 ENCOUNTER — Telehealth: Payer: Self-pay | Admitting: Family Medicine

## 2019-12-29 MED ORDER — TRIAMCINOLONE ACETONIDE 0.1 % EX CREA: 1.0000 "application " | TOPICAL_CREAM | Freq: Two times a day (BID) | CUTANEOUS | 0 refills | Status: DC

## 2019-12-29 NOTE — Telephone Encounter (Signed)
Please advise her to try Xyzal before bed, instead of Allegra/Benadryl. There is a topical Benadryl gel that is over-the-counter that can be very soothing.  I called in a steroid cream she can also try if needed.  This would be just to have on hand in case the above recommendations were not helpful.  [facility] [place]-so would be available to her this weekend if needed.  Please schedule her on Monday if she is able.  If she is not able to be seen on Monday, then as early as possible next week.   Please tell her happy belated birthday for me.

## 2019-12-29 NOTE — Telephone Encounter (Signed)
Pt was called and given information, she verbalized understanding. She Will call on Monday morning to schedule do not not knowing her kids schedules yet.

## 2019-12-29 NOTE — Addendum Note (Signed)
Addended by: Howard Pouch A on: 12/29/2019 04:55 PM   Modules accepted: Orders

## 2019-12-29 NOTE — Telephone Encounter (Signed)
Patient called in and says she has had a rash on her neck and chest for roughly 10 days and it is getting worse. She has taken Allegra during the day and Benadryl at night with no relief. States that if she happens to scratch it it becomes very inflamed and angry. Was going to try and get an appointment with you today and none were available.  Said she has tried a new detergent but the rash is not an area where her clothes touch or rub.

## 2020-01-16 ENCOUNTER — Ambulatory Visit (HOSPITAL_BASED_OUTPATIENT_CLINIC_OR_DEPARTMENT_OTHER)
Admission: RE | Admit: 2020-01-16 | Discharge: 2020-01-16 | Disposition: A | Discharge status: Home / Self Care | Payer: 59 | Source: Ambulatory Visit | Attending: Family Medicine | Admitting: Family Medicine

## 2020-01-16 ENCOUNTER — Other Ambulatory Visit: Payer: Self-pay

## 2020-01-16 DIAGNOSIS — Z1231 Encounter for screening mammogram for malignant neoplasm of breast: Secondary | ICD-10-CM | POA: Insufficient documentation

## 2020-01-19 ENCOUNTER — Other Ambulatory Visit: Payer: Self-pay | Admitting: Family Medicine

## 2020-01-19 DIAGNOSIS — R928 Other abnormal and inconclusive findings on diagnostic imaging of breast: Secondary | ICD-10-CM

## 2020-01-22 ENCOUNTER — Telehealth: Payer: Self-pay

## 2020-01-22 NOTE — Telephone Encounter (Signed)
Signed copy faxed, confirmation received. Sent for scan.

## 2020-01-22 NOTE — Telephone Encounter (Signed)
Received faxed order from [facility] asking for Diagnostic mammogram, bilateral w/ [place], US aspiration, Stereo. Placed on Dr Dierdre Highman desk to review.

## 2020-01-22 NOTE — Telephone Encounter (Signed)
Signed paper request and placed on [facility] work station.  Also had signed request per EMR prior to paper copy.

## 2020-02-01 ENCOUNTER — Other Ambulatory Visit: Payer: 59

## 2020-02-01 ENCOUNTER — Other Ambulatory Visit: Payer: Self-pay

## 2020-02-01 ENCOUNTER — Ambulatory Visit
Admission: RE | Admit: 2020-02-01 | Discharge: 2020-02-01 | Disposition: A | Discharge status: Home / Self Care | Payer: 59 | Source: Ambulatory Visit | Attending: Family Medicine | Admitting: Family Medicine

## 2020-02-01 ENCOUNTER — Other Ambulatory Visit: Payer: Self-pay | Admitting: Family Medicine

## 2020-02-01 DIAGNOSIS — N6310 Unspecified lump in the right breast, unspecified quadrant: Secondary | ICD-10-CM

## 2020-02-01 DIAGNOSIS — R928 Other abnormal and inconclusive findings on diagnostic imaging of breast: Secondary | ICD-10-CM

## 2020-02-01 DIAGNOSIS — R922 Inconclusive mammogram: Secondary | ICD-10-CM | POA: Diagnosis not present

## 2020-02-01 DIAGNOSIS — N6489 Other specified disorders of breast: Secondary | ICD-10-CM | POA: Diagnosis not present

## 2020-02-05 MED FILL — buPROPion HCL 100 MG TABS: 100 | 90 days supply | Qty: 135 | Fill #0

## 2020-02-06 ENCOUNTER — Other Ambulatory Visit: Payer: Self-pay

## 2020-02-06 ENCOUNTER — Other Ambulatory Visit: Payer: Self-pay | Admitting: Family Medicine

## 2020-02-06 ENCOUNTER — Ambulatory Visit
Admission: RE | Admit: 2020-02-06 | Discharge: 2020-02-06 | Disposition: A | Discharge status: Home / Self Care | Payer: 59 | Source: Ambulatory Visit | Attending: Family Medicine | Admitting: Family Medicine

## 2020-02-06 DIAGNOSIS — N6310 Unspecified lump in the right breast, unspecified quadrant: Secondary | ICD-10-CM

## 2020-02-06 DIAGNOSIS — N6041 Mammary duct ectasia of right breast: Secondary | ICD-10-CM | POA: Diagnosis not present

## 2020-02-06 DIAGNOSIS — N6341 Unspecified lump in right breast, subareolar: Secondary | ICD-10-CM | POA: Diagnosis not present

## 2020-02-07 ENCOUNTER — Other Ambulatory Visit: Payer: Self-pay

## 2020-02-07 ENCOUNTER — Other Ambulatory Visit: Payer: Self-pay | Admitting: Family Medicine

## 2020-02-07 DIAGNOSIS — N63 Unspecified lump in unspecified breast: Secondary | ICD-10-CM

## 2020-02-08 ENCOUNTER — Encounter: Payer: Self-pay | Admitting: Family Medicine

## 2020-02-08 DIAGNOSIS — R928 Other abnormal and inconclusive findings on diagnostic imaging of breast: Secondary | ICD-10-CM | POA: Insufficient documentation

## 2020-02-15 ENCOUNTER — Other Ambulatory Visit: Payer: 59

## 2020-02-27 ENCOUNTER — Ambulatory Visit
Admission: RE | Admit: 2020-02-27 | Discharge: 2020-02-27 | Disposition: A | Discharge status: Home / Self Care | Payer: 59 | Source: Ambulatory Visit | Attending: Family Medicine | Admitting: Family Medicine

## 2020-02-27 ENCOUNTER — Other Ambulatory Visit: Payer: Self-pay

## 2020-02-27 DIAGNOSIS — N63 Unspecified lump in unspecified breast: Secondary | ICD-10-CM

## 2020-02-27 DIAGNOSIS — D242 Benign neoplasm of left breast: Secondary | ICD-10-CM | POA: Diagnosis not present

## 2020-02-27 DIAGNOSIS — N6325 Unspecified lump in the left breast, overlapping quadrants: Secondary | ICD-10-CM | POA: Diagnosis not present

## 2020-03-01 ENCOUNTER — Ambulatory Visit: Payer: Self-pay | Admitting: Surgery

## 2020-03-01 DIAGNOSIS — D241 Benign neoplasm of right breast: Secondary | ICD-10-CM

## 2020-03-01 DIAGNOSIS — D242 Benign neoplasm of left breast: Secondary | ICD-10-CM

## 2020-03-01 NOTE — H&P (Signed)
History of Present Illness [name]. Susan Armas MD; 03/01/2020 11:29 AM) The patient is a 44 year old female who presents with a breast mass. PCP - [name] Reason for consultation - bilateral intraductal papillomas  This is a 44 year old female with no significant past medical history who presents with abnormal bilateral screening mammograms. She had bilateral findings near the areola on each side. Both of these were biopsied and revealed signs of intraductal papilloma with no sign of malignancy. The patient was asymptomatic prior to her screening mammograms. The one on the right is located at 9:00 just outside the areola. The one on the left is located at 3:00 just outside the areola. He is accompanied by her husband today. He is a family Engineer, petroleum at [employer].  Menarche - 72 First pregnancy - 28 Breastfeed - yes (6 children) OCP - 10 years LMP - 1 month FH - maternal aunt  CLINICAL DATA: Screening.  EXAM: DIGITAL SCREENING BILATERAL MAMMOGRAM WITH TOMO AND CAD  COMPARISON: Previous exam(s).  ACR Breast Density Category c: The breast tissue is heterogeneously dense, which may obscure small masses.  FINDINGS: In the right breast a possible mass requires further evaluation.  In the left breast a possible area of distortion and separate possible asymmetry requires further evaluation.  Images were processed with CAD.  IMPRESSION: Further evaluation is suggested for possible mass in the right breast.  Further evaluation is suggested for possible distortion and separate possible asymmetry in the left breast.  RECOMMENDATION: Diagnostic mammogram and possibly ultrasound of both breasts. (Code:[id number])  The patient will be contacted regarding the findings, and additional imaging will be scheduled.  BI-RADS CATEGORY 0: Incomplete. Need additional imaging evaluation and/or prior mammograms for comparison.   Electronically Signed By: Audie Pinto M.D. On: 01/17/2020 12:44  CLINICAL DATA: Screening recall for possible bilateral breast masses and possible left breast distortion.  EXAM: DIGITAL DIAGNOSTIC BILATERAL MAMMOGRAM WITH CAD AND TOMO  ULTRASOUND BILATERAL BREAST  COMPARISON: Previous exam(s).  ACR Breast Density Category c: The breast tissue is heterogeneously dense, which may obscure small masses.  FINDINGS: Spot compression tomosynthesis images through the lateral anterior right breast demonstrates a persistent possible obscured mass. Similarly in the lateral posterior to lower outer left breast there is another low-density possible obscured mass. Spot compression tomosynthesis images through the central left breast demonstrates resolution of the questioned distortion.  Mammographic images were processed with CAD.  Ultrasound targeted to the lateral aspect of the right breast demonstrates multiple dilated ducts. There are a few ducts clustered at the 9 o'clock position which demonstrate what is likely intraductal debris, though a solid mass such as a papilloma cannot be excluded. No blood flow is seen within this hypoechoic intraductal material.  Ultrasound of the lateral aspect of the retroareolar left breast demonstrates multiple dilated ducts. A similar appearing focus of presumed intraductal debris is seen in the left breast at 3 o'clock, 1 cm from the nipple measuring 6 mm.  IMPRESSION: 1. There are multiple dilated ducts in the lateral retroareolar right breast with a possible intraductal mass versus debris. Further evaluation with biopsy is recommended.  2. There is a likely focus of intraductal debris in the left breast at 3 o'clock, 1 cm from the nipple.  3. The questioned distortion in the left breast on the screening mammogram resolves  RECOMMENDATION: Ultrasound guided biopsy is recommended for the intraductal right breast mass. This has been scheduled for 02/06/2020 at  2:45 p.m.  If the pathology  indicates intraductal debris/chronic inflammation, it can be presumed that the similar appearing area in the left breast at 3 o'clock is the same process. However, if papilloma or other abnormality is identified, then ultrasound-guided biopsy of the left breast at 3 o'clock, 1 cm from the nipple would be recommended.  I have discussed the findings and recommendations with the patient. If applicable, a reminder letter will be sent to the patient regarding the next appointment.  BI-RADS CATEGORY 4: Suspicious.   Electronically Signed By: Ammie Ferrier M.D. On: 02/01/2020 16:30  CLINICAL DATA: Patient presents for ultrasound-guided core biopsy of intraductal mass in the RIGHT breast.  EXAM: ULTRASOUND GUIDED RIGHT BREAST CORE NEEDLE BIOPSY  COMPARISON: Previous exam(s).  PROCEDURE: I met with the patient and we discussed the procedure of ultrasound-guided biopsy, including benefits and alternatives. We discussed the high likelihood of a successful procedure. We discussed the risks of the procedure, including infection, bleeding, tissue injury, clip migration, and inadequate sampling. Informed written consent was given. The usual time-out protocol was performed immediately prior to the procedure.  Lesion quadrant: 9 o'clock retroareolar region RIGHT  Using sterile technique and 1% Lidocaine as local anesthetic, under direct ultrasound visualization, a 12 gauge spring-loaded device was used to perform biopsy of intraductal mass in the 9 o'clock retroareolar location of the RIGHT breast using a inferior to superior approach. At the conclusion of the procedure ribbon shaped tissue marker clip was deployed into the biopsy cavity. Follow up 2 view mammogram was performed and dictated separately.  IMPRESSION: Ultrasound guided biopsy of RIGHT breast mass. No apparent complications.  Electronically Signed: By: Nolon Nations M.D. On:  02/06/2020 15:50  CLINICAL DATA: Patient with indeterminate mass left breast 3 o'clock position.  EXAM: ULTRASOUND GUIDED LEFT BREAST CORE NEEDLE BIOPSY  COMPARISON: Previous exam(s).  PROCEDURE: I met with the patient and we discussed the procedure of ultrasound-guided biopsy, including benefits and alternatives. We discussed the high likelihood of a successful procedure. We discussed the risks of the procedure, including infection, bleeding, tissue injury, clip migration, and inadequate sampling. Informed written consent was given. The usual time-out protocol was performed immediately prior to the procedure.  Lesion quadrant: Lower outer quadrant  Using sterile technique and 1% Lidocaine as local anesthetic, under direct ultrasound visualization, a 14 gauge spring-loaded device was used to perform biopsy of left breast intraductal mass 3 o'clock position using a lateral approach. At the conclusion of the procedure a ribbon shaped tissue marker clip was deployed into the biopsy cavity. Follow up 2 view mammogram was performed and dictated separately.  IMPRESSION: Ultrasound guided biopsy of left breast mass 3 o'clock position. No apparent complications.  Electronically Signed: By: Lovey Newcomer M.D.   Problem List/Past Medical Rodman Key K. 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 Owens Shark, [place]; 03/01/2020 9:26 AM) No pertinent past surgical history  Diagnostic Studies History (Tanisha A. Owens Shark, [place]; 03/01/2020 9:26 AM) Colonoscopy never Mammogram within last year Pap Smear 1-5 years ago  Allergies (Tanisha A. Owens Shark, [place]; 03/01/2020 9:27 AM) Cipro *FLUOROQUINOLONES* Allergies Reconciled  Medication History (Tanisha A. Owens Shark, [place]; 03/01/2020 9:27 AM) buPROPion HCl (100MG Tablet, Oral) Active. PARoxetine HCl (30MG Tablet, Oral) Active. Fluticasone Propionate (50MCG/ACT Suspension,  Nasal) Active. Medications Reconciled  Social History (Tanisha A. Owens Shark, [place]; 03/01/2020 9:26 AM) Alcohol use Occasional alcohol use. Caffeine use Coffee. No drug use Tobacco use Never smoker.  Family History (Tanisha A. Owens Shark, [place]; 03/01/2020 9:26 AM) Alcohol Abuse Brother.  Breast Cancer Family Members In General. Cancer Family Members In General. Cervical Cancer Family Members In General. Colon Cancer Family Members In General. Diabetes Mellitus Father. Heart Disease Family Members In General. Hypertension Family Members In General. Ovarian Cancer Family Members In General.  Pregnancy / Birth History (Tanisha A. Owens Shark, RMA; 03/01/2020 9:26 AM) Age at menarche 65 years. Contraceptive History Contraceptive implant, Oral contraceptives. Gravida 5 Length (months) of breastfeeding 12-24 Maternal age 61-30 Para 6 Regular periods     Review of Systems (Tanisha A. Brown RMA; 03/01/2020 9:26 AM) General Not Present- Appetite Loss, Chills, Fatigue, Fever, Night Sweats, Weight Gain and Weight Loss. Skin Not Present- Change in Wart/Mole, Dryness, Hives, Jaundice, New Lesions, Non-Healing Wounds, Rash and Ulcer. HEENT Not Present- Earache, Hearing Loss, Hoarseness, Nose Bleed, Oral Ulcers, Ringing in the Ears, Seasonal Allergies, Sinus Pain, Sore Throat, Visual Disturbances, Wears glasses/contact lenses and Yellow Eyes. Respiratory Not Present- Bloody sputum, Chronic Cough, Difficulty Breathing, Snoring and Wheezing. Breast Not Present- Breast Mass, Breast Pain, Nipple Discharge and Skin Changes. Gastrointestinal Not Present- Abdominal Pain, Bloating, Bloody Stool, Change in Bowel Habits, Chronic diarrhea, Constipation, Difficulty Swallowing, Excessive gas, Gets full quickly at meals, Hemorrhoids, Indigestion, Nausea, Rectal Pain and Vomiting. Musculoskeletal Not Present- Back Pain, Joint Pain, Joint Stiffness, Muscle Pain, Muscle Weakness and Swelling of  Extremities. Neurological Not Present- Decreased Memory, Fainting, Headaches, Numbness, Seizures, Tingling, Tremor, Trouble walking and Weakness. Endocrine Not Present- Cold Intolerance, Excessive Hunger, Hair Changes, Heat Intolerance, Hot flashes and New Diabetes. Hematology Not Present- Blood Thinners, Easy Bruising, Excessive bleeding, Gland problems, HIV and Persistent Infections.  Vitals (Tanisha A. Brown RMA; 03/01/2020 9:28 AM) 03/01/2020 9:27 AM Weight: 166.2 lb Height: 69in Body Surface Area: 1.91 m Body Mass Index: 24.54 kg/m  Temp.: 50F  Pulse: 96 (Regular)  BP: 124/82 (Sitting, Left Arm, Standard)        Physical Exam Rodman Key K. Primrose Oler MD; 03/01/2020 11:30 AM)  The physical exam findings are as follows: Note:Constitutional: WDWN in NAD, conversant, no obvious deformities; Eyes: Pupils equal, round; sclera anicteric; moist conjunctiva; no lid lag HENT: Oral mucosa moist; good dentition Neck: No masses palpated, trachea midline; no thyromegaly Breasts: No axillary lymphadenopathy on either side, bilateral fibrocystic changes, no dominant masses on either side, no nipple retraction or discharge. Patient does have slight bruising on the left side from her recent biopsy. Lungs: CTA bilaterally; normal respiratory effort CV: Regular rate and rhythm; no murmurs; extremities well-perfused with no edema Abd: +bowel sounds, soft, non-tender, no palpable organomegaly; no palpable hernias Musc: Normal gait; no apparent clubbing or cyanosis in extremities Lymphatic: No palpable cervical or axillary lymphadenopathy Skin: Warm, dry; no sign of jaundice Psychiatric - alert and oriented x 4; calm mood and affect    Assessment & Plan Rodman Key K. 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 The surgical procedure has been discussed with the patient.  Potential risks, benefits, alternative treatments, and expected outcomes have been explained. All of the patient's questions at this time have been answered. The likelihood of reaching the patient's treatment goal is good. The patient understand the proposed surgical procedure and wishes to proceed.  [name]. Georgette Dover, MD, [facility]  General/ Trauma Surgery   03/01/2020 11:30 AM

## 2020-03-05 ENCOUNTER — Other Ambulatory Visit: Payer: Self-pay | Admitting: Surgery

## 2020-03-05 DIAGNOSIS — D242 Benign neoplasm of left breast: Secondary | ICD-10-CM

## 2020-03-05 DIAGNOSIS — D241 Benign neoplasm of right breast: Secondary | ICD-10-CM

## 2020-03-07 MED FILL — PARoxetine HCL 30 MG TABS: 30 | 90 days supply | Qty: 90 | Fill #1

## 2020-04-08 ENCOUNTER — Other Ambulatory Visit: Payer: Self-pay

## 2020-04-08 ENCOUNTER — Encounter (HOSPITAL_BASED_OUTPATIENT_CLINIC_OR_DEPARTMENT_OTHER): Payer: Self-pay | Admitting: Surgery

## 2020-04-11 ENCOUNTER — Other Ambulatory Visit: Payer: Self-pay | Admitting: Surgery

## 2020-04-11 DIAGNOSIS — N6320 Unspecified lump in the left breast, unspecified quadrant: Secondary | ICD-10-CM

## 2020-04-12 ENCOUNTER — Ambulatory Visit
Admission: RE | Admit: 2020-04-12 | Discharge: 2020-04-12 | Disposition: A | Discharge status: Home / Self Care | Payer: 59 | Source: Ambulatory Visit | Attending: Surgery | Admitting: Surgery

## 2020-04-12 ENCOUNTER — Other Ambulatory Visit: Payer: Self-pay

## 2020-04-12 DIAGNOSIS — N6320 Unspecified lump in the left breast, unspecified quadrant: Secondary | ICD-10-CM

## 2020-04-12 DIAGNOSIS — N6489 Other specified disorders of breast: Secondary | ICD-10-CM | POA: Diagnosis not present

## 2020-04-12 MED FILL — DOXYCYCLINE HYCLATE 100 MG: 100 | 7 days supply | Qty: 14 | Fill #0

## 2020-04-13 ENCOUNTER — Other Ambulatory Visit (HOSPITAL_COMMUNITY): Payer: 59

## 2020-05-07 ENCOUNTER — Other Ambulatory Visit: Payer: Self-pay

## 2020-05-07 ENCOUNTER — Encounter (HOSPITAL_BASED_OUTPATIENT_CLINIC_OR_DEPARTMENT_OTHER): Payer: Self-pay | Admitting: Surgery

## 2020-05-11 ENCOUNTER — Other Ambulatory Visit (HOSPITAL_COMMUNITY)
Admission: RE | Admit: 2020-05-11 | Discharge: 2020-05-11 | Disposition: A | Discharge status: Home / Self Care | Payer: 59 | Source: Ambulatory Visit | Attending: Surgery | Admitting: Surgery

## 2020-05-11 DIAGNOSIS — Z20822 Contact with and (suspected) exposure to covid-19: Secondary | ICD-10-CM | POA: Diagnosis not present

## 2020-05-11 DIAGNOSIS — Z01812 Encounter for preprocedural laboratory examination: Secondary | ICD-10-CM | POA: Insufficient documentation

## 2020-05-11 LAB — SARS CORONAVIRUS 2 (TAT 6-24 HRS): SARS Coronavirus 2: NEGATIVE

## 2020-05-14 ENCOUNTER — Ambulatory Visit
Admission: RE | Admit: 2020-05-14 | Discharge: 2020-05-14 | Disposition: A | Discharge status: Home / Self Care | Payer: 59 | Source: Ambulatory Visit | Attending: Surgery | Admitting: Surgery

## 2020-05-14 ENCOUNTER — Other Ambulatory Visit: Payer: Self-pay

## 2020-05-14 DIAGNOSIS — D242 Benign neoplasm of left breast: Secondary | ICD-10-CM

## 2020-05-14 DIAGNOSIS — D241 Benign neoplasm of right breast: Secondary | ICD-10-CM

## 2020-05-14 NOTE — Anesthesia Preprocedure Evaluation (Addendum)
Anesthesia Evaluation  Patient identified by MRN, date of birth, ID band Patient awake    Reviewed: Allergy & Precautions, NPO status , Patient's Chart, lab work & pertinent test results  Airway Mallampati: II  TM Distance: >3 FB Neck ROM: Full    Dental no notable dental hx. (+) Teeth Intact, Dental Advisory Given   Pulmonary asthma ,    Pulmonary exam normal breath sounds clear to auscultation       Cardiovascular Exercise Tolerance: Good Normal cardiovascular exam Rhythm:Regular Rate:Normal     Neuro/Psych PSYCHIATRIC DISORDERS negative neurological ROS     GI/Hepatic Neg liver ROS, GERD  ,  Endo/Other  negative endocrine ROS  Renal/GU negative Renal ROS     Musculoskeletal negative musculoskeletal ROS (+)   Abdominal   Peds  Hematology  (+) anemia ,   Anesthesia Other Findings   Reproductive/Obstetrics                            Anesthesia Physical Anesthesia Plan  ASA: II  Anesthesia Plan: General   Post-op Pain Management:    Induction:   PONV Risk Score and Plan: 4 or greater and Treatment may vary due to age or medical condition, Ondansetron and Dexamethasone  Airway Management Planned: LMA  Additional Equipment: None  Intra-op Plan:   Post-operative Plan:   Informed Consent: I have reviewed the patients History and Physical, chart, labs and discussed the procedure including the risks, benefits and alternatives for the proposed anesthesia with the patient or authorized representative who has indicated his/her understanding and acceptance.     Dental advisory given  Plan Discussed with: CRNA  Anesthesia Plan Comments:        Anesthesia Quick Evaluation

## 2020-05-15 ENCOUNTER — Other Ambulatory Visit: Payer: Self-pay

## 2020-05-15 ENCOUNTER — Encounter (HOSPITAL_BASED_OUTPATIENT_CLINIC_OR_DEPARTMENT_OTHER): Payer: Self-pay | Admitting: Surgery

## 2020-05-15 ENCOUNTER — Ambulatory Visit (HOSPITAL_BASED_OUTPATIENT_CLINIC_OR_DEPARTMENT_OTHER): Payer: 59 | Admitting: Anesthesiology

## 2020-05-15 ENCOUNTER — Ambulatory Visit
Admission: RE | Admit: 2020-05-15 | Discharge: 2020-05-15 | Disposition: A | Discharge status: Home / Self Care | Payer: 59 | Source: Ambulatory Visit | Attending: Surgery | Admitting: Surgery

## 2020-05-15 ENCOUNTER — Encounter (HOSPITAL_BASED_OUTPATIENT_CLINIC_OR_DEPARTMENT_OTHER)
Admission: RE | Disposition: A | Discharge status: Home / Self Care | Payer: Self-pay | Source: Home / Self Care | Attending: Surgery

## 2020-05-15 ENCOUNTER — Ambulatory Visit (HOSPITAL_BASED_OUTPATIENT_CLINIC_OR_DEPARTMENT_OTHER)
Admission: RE | Admit: 2020-05-15 | Discharge: 2020-05-15 | Disposition: A | Discharge status: Home / Self Care | Payer: 59 | Attending: Surgery | Admitting: Surgery

## 2020-05-15 DIAGNOSIS — N6489 Other specified disorders of breast: Secondary | ICD-10-CM | POA: Diagnosis not present

## 2020-05-15 DIAGNOSIS — Z881 Allergy status to other antibiotic agents status: Secondary | ICD-10-CM | POA: Insufficient documentation

## 2020-05-15 DIAGNOSIS — D241 Benign neoplasm of right breast: Secondary | ICD-10-CM | POA: Diagnosis not present

## 2020-05-15 DIAGNOSIS — D242 Benign neoplasm of left breast: Secondary | ICD-10-CM

## 2020-05-15 DIAGNOSIS — J45909 Unspecified asthma, uncomplicated: Secondary | ICD-10-CM | POA: Diagnosis not present

## 2020-05-15 DIAGNOSIS — R928 Other abnormal and inconclusive findings on diagnostic imaging of breast: Secondary | ICD-10-CM | POA: Diagnosis not present

## 2020-05-15 DIAGNOSIS — Z79899 Other long term (current) drug therapy: Secondary | ICD-10-CM | POA: Insufficient documentation

## 2020-05-15 DIAGNOSIS — N6082 Other benign mammary dysplasias of left breast: Secondary | ICD-10-CM | POA: Diagnosis not present

## 2020-05-15 DIAGNOSIS — N6081 Other benign mammary dysplasias of right breast: Secondary | ICD-10-CM | POA: Diagnosis not present

## 2020-05-15 DIAGNOSIS — D649 Anemia, unspecified: Secondary | ICD-10-CM | POA: Diagnosis not present

## 2020-05-15 HISTORY — DX: Gastro-esophageal reflux disease without esophagitis: K21.9

## 2020-05-15 HISTORY — PX: BREAST LUMPECTOMY WITH RADIOACTIVE SEED LOCALIZATION: SHX6424

## 2020-05-15 LAB — POCT PREGNANCY, URINE: Preg Test, Ur: NEGATIVE

## 2020-05-15 SURGERY — BREAST LUMPECTOMY WITH RADIOACTIVE SEED LOCALIZATION
Anesthesia: General | Site: Breast | Laterality: Bilateral

## 2020-05-15 MED ORDER — FENTANYL CITRATE (PF) 100 MCG/2ML IJ SOLN
INTRAMUSCULAR | Status: DC | PRN
  Administered 2020-05-15 (×2): 25 ug via INTRAVENOUS
  Administered 2020-05-15: 50 ug via INTRAVENOUS

## 2020-05-15 MED ORDER — GABAPENTIN 300 MG PO CAPS
ORAL_CAPSULE | ORAL | Status: AC
  Filled 2020-05-15: qty 1

## 2020-05-15 MED ORDER — ACETAMINOPHEN 500 MG PO TABS
1000.0000 mg | ORAL_TABLET | ORAL | Status: AC
  Administered 2020-05-15: 1000 mg via ORAL

## 2020-05-15 MED ORDER — CHLORHEXIDINE GLUCONATE 0.12 % MT SOLN
15.0000 mL | Freq: Once | OROMUCOSAL | Status: DC
  Filled 2020-05-15: qty 15

## 2020-05-15 MED ORDER — LACTATED RINGERS IV SOLN: INTRAVENOUS | Status: DC

## 2020-05-15 MED ORDER — ONDANSETRON HCL 4 MG/2ML IJ SOLN
INTRAMUSCULAR | Status: DC | PRN
  Administered 2020-05-15: 4 mg via INTRAVENOUS

## 2020-05-15 MED ORDER — MIDAZOLAM HCL 2 MG/2ML IJ SOLN: 1.0000 mg | INTRAMUSCULAR | Status: DC | PRN

## 2020-05-15 MED ORDER — PROPOFOL 10 MG/ML IV BOLUS
INTRAVENOUS | Status: DC | PRN
  Administered 2020-05-15: 100 mg via INTRAVENOUS
  Administered 2020-05-15: 30 mg via INTRAVENOUS

## 2020-05-15 MED ORDER — FENTANYL CITRATE (PF) 100 MCG/2ML IJ SOLN
INTRAMUSCULAR | Status: AC
  Filled 2020-05-15: qty 2

## 2020-05-15 MED ORDER — EPHEDRINE SULFATE 50 MG/ML IJ SOLN
INTRAMUSCULAR | Status: DC | PRN
  Administered 2020-05-15: 10 mg via INTRAVENOUS
  Administered 2020-05-15: 5 mg via INTRAVENOUS
  Administered 2020-05-15 (×2): 10 mg via INTRAVENOUS

## 2020-05-15 MED ORDER — ORAL CARE MOUTH RINSE: 15.0000 mL | Freq: Once | OROMUCOSAL | Status: DC

## 2020-05-15 MED ORDER — CEFAZOLIN SODIUM-DEXTROSE 2-4 GM/100ML-% IV SOLN
2.0000 g | INTRAVENOUS | Status: AC
  Administered 2020-05-15: 2 g via INTRAVENOUS

## 2020-05-15 MED ORDER — DEXAMETHASONE SODIUM PHOSPHATE 4 MG/ML IJ SOLN
INTRAMUSCULAR | Status: DC | PRN
  Administered 2020-05-15: 5 mg via INTRAVENOUS

## 2020-05-15 MED ORDER — KETOROLAC TROMETHAMINE 30 MG/ML IJ SOLN: 30.0000 mg | Freq: Once | INTRAMUSCULAR | Status: DC | PRN

## 2020-05-15 MED ORDER — ONDANSETRON HCL 4 MG/2ML IJ SOLN: 4.0000 mg | Freq: Once | INTRAMUSCULAR | Status: DC | PRN

## 2020-05-15 MED ORDER — DEXAMETHASONE SODIUM PHOSPHATE 10 MG/ML IJ SOLN
INTRAMUSCULAR | Status: AC
  Filled 2020-05-15: qty 1

## 2020-05-15 MED ORDER — ONDANSETRON HCL 4 MG/2ML IJ SOLN
INTRAMUSCULAR | Status: AC
  Filled 2020-05-15: qty 2

## 2020-05-15 MED ORDER — HYDROMORPHONE HCL 1 MG/ML IJ SOLN: 0.2500 mg | INTRAMUSCULAR | Status: DC | PRN

## 2020-05-15 MED ORDER — FENTANYL CITRATE (PF) 100 MCG/2ML IJ SOLN: 50.0000 ug | INTRAMUSCULAR | Status: DC | PRN

## 2020-05-15 MED ORDER — BUPIVACAINE HCL (PF) 0.25 % IJ SOLN
INTRAMUSCULAR | Status: DC | PRN
  Administered 2020-05-15: 20 mL

## 2020-05-15 MED ORDER — PROPOFOL 10 MG/ML IV BOLUS
INTRAVENOUS | Status: AC
  Filled 2020-05-15: qty 20

## 2020-05-15 MED ORDER — CEFAZOLIN SODIUM-DEXTROSE 2-4 GM/100ML-% IV SOLN
INTRAVENOUS | Status: AC
  Filled 2020-05-15: qty 100

## 2020-05-15 MED ORDER — GABAPENTIN 300 MG PO CAPS
300.0000 mg | ORAL_CAPSULE | ORAL | Status: AC
  Administered 2020-05-15: 300 mg via ORAL

## 2020-05-15 MED ORDER — OXYCODONE HCL 5 MG/5ML PO SOLN: 5.0000 mg | Freq: Once | ORAL | Status: DC | PRN

## 2020-05-15 MED ORDER — LIDOCAINE HCL (CARDIAC) PF 100 MG/5ML IV SOSY
PREFILLED_SYRINGE | INTRAVENOUS | Status: DC | PRN
  Administered 2020-05-15: 100 mg via INTRAVENOUS

## 2020-05-15 MED ORDER — MIDAZOLAM HCL 2 MG/2ML IJ SOLN
INTRAMUSCULAR | Status: AC
  Filled 2020-05-15: qty 2

## 2020-05-15 MED ORDER — MIDAZOLAM HCL 5 MG/5ML IJ SOLN
INTRAMUSCULAR | Status: DC | PRN
  Administered 2020-05-15: 2 mg via INTRAVENOUS

## 2020-05-15 MED ORDER — HYDROCODONE-ACETAMINOPHEN 5-325 MG PO TABS: 1.0000 | ORAL_TABLET | Freq: Four times a day (QID) | ORAL | 0 refills | Status: DC | PRN

## 2020-05-15 MED ORDER — CHLORHEXIDINE GLUCONATE CLOTH 2 % EX PADS: 6.0000 | MEDICATED_PAD | Freq: Once | CUTANEOUS | Status: DC

## 2020-05-15 MED ORDER — DEXMEDETOMIDINE HCL 200 MCG/2ML IV SOLN
INTRAVENOUS | Status: DC | PRN
  Administered 2020-05-15: 8 ug via INTRAVENOUS

## 2020-05-15 MED ORDER — OXYCODONE HCL 5 MG PO TABS: 5.0000 mg | ORAL_TABLET | Freq: Once | ORAL | Status: DC | PRN

## 2020-05-15 MED ORDER — ACETAMINOPHEN 500 MG PO TABS
ORAL_TABLET | ORAL | Status: AC
  Filled 2020-05-15: qty 2

## 2020-05-15 MED FILL — HYDROCODON-APAP 5-325: 5-325 | 3 days supply | Qty: 15 | Fill #0

## 2020-05-15 SURGICAL SUPPLY — 40 items
APPLIER CLIP 9.375 MED OPEN (MISCELLANEOUS) ×3
BENZOIN TINCTURE PRP APPL 2/3 (GAUZE/BANDAGES/DRESSINGS) ×3 IMPLANT
BLADE SURG 15 STRL LF DISP TIS (BLADE) ×1 IMPLANT
BLADE SURG 15 STRL SS (BLADE) ×3
CHLORAPREP W/TINT 26 (MISCELLANEOUS) ×3 IMPLANT
CLIP APPLIE 9.375 MED OPEN (MISCELLANEOUS) ×1 IMPLANT
CLOSURE WOUND 1/2 X4 (GAUZE/BANDAGES/DRESSINGS) ×1
COVER BACK TABLE 60X90IN (DRAPES) ×3 IMPLANT
COVER MAYO STAND STRL (DRAPES) ×3 IMPLANT
COVER PROBE W GEL 5X96 (DRAPES) ×3 IMPLANT
DRAPE LAPAROSCOPIC ABDOMINAL (DRAPES) ×3 IMPLANT
DRAPE LAPAROTOMY 100X72 PEDS (DRAPES) ×3 IMPLANT
DRAPE UTILITY XL STRL (DRAPES) ×3 IMPLANT
DRSG TEGADERM 2-3/8X2-3/4 SM (GAUZE/BANDAGES/DRESSINGS) ×6 IMPLANT
DRSG TEGADERM 4X4.75 (GAUZE/BANDAGES/DRESSINGS) ×3 IMPLANT
ELECT REM PT RETURN 9FT ADLT (ELECTROSURGICAL) ×3
ELECTRODE REM PT RTRN 9FT ADLT (ELECTROSURGICAL) ×1 IMPLANT
GLOVE BIO SURGEON STRL SZ 6.5 (GLOVE) ×2 IMPLANT
GLOVE BIO SURGEON STRL SZ7 (GLOVE) ×3 IMPLANT
GLOVE BIO SURGEONS STRL SZ 6.5 (GLOVE) ×1
GLOVE BIOGEL PI IND STRL 7.5 (GLOVE) ×1 IMPLANT
GLOVE BIOGEL PI INDICATOR 7.5 (GLOVE) ×2
GOWN STRL REUS W/ TWL LRG LVL3 (GOWN DISPOSABLE) ×2 IMPLANT
GOWN STRL REUS W/TWL LRG LVL3 (GOWN DISPOSABLE) ×6
KIT MARKER MARGIN INK (KITS) ×3 IMPLANT
NEEDLE HYPO 25X1 1.5 SAFETY (NEEDLE) ×3 IMPLANT
NS IRRIG 1000ML POUR BTL (IV SOLUTION) ×3 IMPLANT
PENCIL SMOKE EVACUATOR (MISCELLANEOUS) ×3 IMPLANT
SET BASIN DAY SURGERY F.S. (CUSTOM PROCEDURE TRAY) ×3 IMPLANT
SLEEVE SCD COMPRESS KNEE MED (MISCELLANEOUS) ×3 IMPLANT
SPONGE GAUZE 2X2 8PLY STER LF (GAUZE/BANDAGES/DRESSINGS) ×1
SPONGE GAUZE 2X2 8PLY STRL LF (GAUZE/BANDAGES/DRESSINGS) ×2 IMPLANT
SPONGE LAP 4X18 RFD (DISPOSABLE) ×6 IMPLANT
STRIP CLOSURE SKIN 1/2X4 (GAUZE/BANDAGES/DRESSINGS) ×2 IMPLANT
SUT MON AB 4-0 PC3 18 (SUTURE) ×3 IMPLANT
SUT VIC AB 3-0 SH 27 (SUTURE) ×3
SUT VIC AB 3-0 SH 27X BRD (SUTURE) ×1 IMPLANT
SYR CONTROL 10ML LL (SYRINGE) ×3 IMPLANT
TOWEL GREEN STERILE FF (TOWEL DISPOSABLE) ×3 IMPLANT
TRAY FAXITRON CT DISP (TRAY / TRAY PROCEDURE) ×3 IMPLANT

## 2020-05-15 NOTE — Anesthesia Procedure Notes (Signed)
Procedure Name: LMA Insertion Date/Time: 05/15/2020 10:48 AM Performed by: Maryella Shivers, CRNA Pre-anesthesia Checklist: Patient identified, Emergency Drugs available, Suction available and Patient being monitored Patient Re-evaluated:Patient Re-evaluated prior to induction Oxygen Delivery Method: Circle system utilized Preoxygenation: Pre-oxygenation with 100% oxygen Induction Type: IV induction Ventilation: Mask ventilation without difficulty LMA: LMA inserted LMA Size: 4.0 Number of attempts: 1 Airway Equipment and Method: Bite block Placement Confirmation: positive ETCO2 Tube secured with: Tape Dental Injury: Teeth and Oropharynx as per pre-operative assessment

## 2020-05-15 NOTE — H&P (Signed)
History of Present Illness  The patient is a 44 year old female who presents with a breast mass. PCP - Howard Pouch Reason for consultation - bilateral intraductal papillomas  This is a 44 year old female with no significant past medical history who presents with abnormal bilateral screening mammograms. She had bilateral findings near the areola on each side. Both of these were biopsied and revealed signs of intraductal papilloma with no sign of malignancy. The patient was asymptomatic prior to her screening mammograms. The one on the right is located at 9:00 just outside the areola. The one on the left is located at 3:00 just outside the areola. He is accompanied by her husband today. He is a family Engineer, petroleum at [employer].  Menarche - 57 First pregnancy - 28 Breastfeed - yes (6 children) OCP - 10 years LMP - 1 month FH - maternal aunt  CLINICAL DATA: Screening.  EXAM: DIGITAL SCREENING BILATERAL MAMMOGRAM WITH TOMO AND CAD  COMPARISON: Previous exam(s).  ACR Breast Density Category c: The breast tissue is heterogeneously dense, which may obscure small masses.  FINDINGS: In the right breast a possible mass requires further evaluation.  In the left breast a possible area of distortion and separate possible asymmetry requires further evaluation.  Images were processed with CAD.  IMPRESSION: Further evaluation is suggested for possible mass in the right breast.  Further evaluation is suggested for possible distortion and separate possible asymmetry in the left breast.  RECOMMENDATION: Diagnostic mammogram and possibly ultrasound of both breasts. (Code:[id number])  The patient will be contacted regarding the findings, and additional imaging will be scheduled.  BI-RADS CATEGORY 0: Incomplete. Need additional imaging evaluation and/or prior mammograms for comparison.   Electronically Signed By: Audie Pinto M.D. On:  01/17/2020 12:44  CLINICAL DATA: Screening recall for possible bilateral breast masses and possible left breast distortion.  EXAM: DIGITAL DIAGNOSTIC BILATERAL MAMMOGRAM WITH CAD AND TOMO  ULTRASOUND BILATERAL BREAST  COMPARISON: Previous exam(s).  ACR Breast Density Category c: The breast tissue is heterogeneously dense, which may obscure small masses.  FINDINGS: Spot compression tomosynthesis images through the lateral anterior right breast demonstrates a persistent possible obscured mass. Similarly in the lateral posterior to lower outer left breast there is another low-density possible obscured mass. Spot compression tomosynthesis images through the central left breast demonstrates resolution of the questioned distortion.  Mammographic images were processed with CAD.  Ultrasound targeted to the lateral aspect of the right breast demonstrates multiple dilated ducts. There are a few ducts clustered at the 9 o'clock position which demonstrate what is likely intraductal debris, though a solid mass such as a papilloma cannot be excluded. No blood flow is seen within this hypoechoic intraductal material.  Ultrasound of the lateral aspect of the retroareolar left breast demonstrates multiple dilated ducts. A similar appearing focus of presumed intraductal debris is seen in the left breast at 3 o'clock, 1 cm from the nipple measuring 6 mm.  IMPRESSION: 1. There are multiple dilated ducts in the lateral retroareolar right breast with a possible intraductal mass versus debris. Further evaluation with biopsy is recommended.  2. There is a likely focus of intraductal debris in the left breast at 3 o'clock, 1 cm from the nipple.  3. The questioned distortion in the left breast on the screening mammogram resolves  RECOMMENDATION: Ultrasound guided biopsy is recommended for the intraductal right breast mass. This has been scheduled for 02/06/2020 at 2:45  p.m.  If the pathology indicates intraductal debris/chronic inflammation, it can  be presumed that the similar appearing area in the left breast at 3 o'clock is the same process. However, if papilloma or other abnormality is identified, then ultrasound-guided biopsy of the left breast at 3 o'clock, 1 cm from the nipple would be recommended.  I have discussed the findings and recommendations with the patient. If applicable, a reminder letter will be sent to the patient regarding the next appointment.  BI-RADS CATEGORY 4: Suspicious.   Electronically Signed By: Ammie Ferrier M.D. On: 02/01/2020 16:30  CLINICAL DATA: Patient presents for ultrasound-guided core biopsy of intraductal mass in the RIGHT breast.  EXAM: ULTRASOUND GUIDED RIGHT BREAST CORE NEEDLE BIOPSY  COMPARISON: Previous exam(s).  PROCEDURE: I met with the patient and we discussed the procedure of ultrasound-guided biopsy, including benefits and alternatives. We discussed the high likelihood of a successful procedure. We discussed the risks of the procedure, including infection, bleeding, tissue injury, clip migration, and inadequate sampling. Informed written consent was given. The usual time-out protocol was performed immediately prior to the procedure.  Lesion quadrant: 9 o'clock retroareolar region RIGHT  Using sterile technique and 1% Lidocaine as local anesthetic, under direct ultrasound visualization, a 12 gauge spring-loaded device was used to perform biopsy of intraductal mass in the 9 o'clock retroareolar location of the RIGHT breast using a inferior to superior approach. At the conclusion of the procedure ribbon shaped tissue marker clip was deployed into the biopsy cavity. Follow up 2 view mammogram was performed and dictated separately.  IMPRESSION: Ultrasound guided biopsy of RIGHT breast mass. No apparent complications.  Electronically Signed: By: Nolon Nations  M.D. On: 02/06/2020 15:50  CLINICAL DATA: Patient with indeterminate mass left breast 3 o'clock position.  EXAM: ULTRASOUND GUIDED LEFT BREAST CORE NEEDLE BIOPSY  COMPARISON: Previous exam(s).  PROCEDURE: I met with the patient and we discussed the procedure of ultrasound-guided biopsy, including benefits and alternatives. We discussed the high likelihood of a successful procedure. We discussed the risks of the procedure, including infection, bleeding, tissue injury, clip migration, and inadequate sampling. Informed written consent was given. The usual time-out protocol was performed immediately prior to the procedure.  Lesion quadrant: Lower outer quadrant  Using sterile technique and 1% Lidocaine as local anesthetic, under direct ultrasound visualization, a 14 gauge spring-loaded device was used to perform biopsy of left breast intraductal mass 3 o'clock position using a lateral approach. At the conclusion of the procedure a ribbon shaped tissue marker clip was deployed into the biopsy cavity. Follow up 2 view mammogram was performed and dictated separately.  IMPRESSION: Ultrasound guided biopsy of left breast mass 3 o'clock position. No apparent complications.  Electronically Signed: By: Lovey Newcomer M.D.   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 PARoxetine HCl (30MG Tablet, Oral) Active. Fluticasone Propionate (50MCG/ACT Suspension, Nasal) Active. Medications Reconciled  Social History  Alcohol use Occasional alcohol use. Caffeine use Coffee. No drug use Tobacco use Never smoker.  Family History   Alcohol Abuse Brother. Breast Cancer Family Members In General. Cancer Family Members In General. Cervical Cancer Family Members In General. Colon Cancer Family Members In General. Diabetes Mellitus Father. Heart Disease Family Members In General. Hypertension Family Members In General. Ovarian Cancer Family Members In General.  Pregnancy / Birth History  Age at menarche 55 years. Contraceptive History Contraceptive implant, Oral contraceptives. Gravida 5 Length (months) of breastfeeding 12-24 Maternal age 21-30 Para 6 Regular periods     Review of Systems  General Not Present- Appetite Loss, Chills, Fatigue, Fever, Night Sweats, Weight Gain and Weight Loss. Skin Not Present- Change in Wart/Mole, Dryness, Hives, Jaundice, New Lesions, Non-Healing Wounds, Rash and Ulcer. HEENT Not Present- Earache, Hearing Loss, Hoarseness, Nose Bleed, Oral Ulcers, Ringing in the Ears, Seasonal Allergies, Sinus Pain, Sore Throat, Visual Disturbances, Wears glasses/contact lenses and Yellow Eyes. Respiratory Not Present- Bloody sputum, Chronic Cough, Difficulty Breathing, Snoring and Wheezing. Breast Not Present- Breast Mass, Breast Pain, Nipple Discharge and Skin Changes. Gastrointestinal Not Present- Abdominal Pain, Bloating, Bloody Stool, Change in Bowel Habits, Chronic diarrhea, Constipation, Difficulty Swallowing, Excessive gas, Gets full quickly at meals, Hemorrhoids, Indigestion, Nausea, Rectal Pain and Vomiting. Musculoskeletal Not Present- Back Pain, Joint Pain, Joint Stiffness, Muscle Pain, Muscle Weakness and Swelling of Extremities. Neurological Not Present- Decreased Memory, Fainting, Headaches, Numbness, Seizures, Tingling, Tremor, Trouble walking and Weakness. Endocrine Not Present- Cold Intolerance, Excessive Hunger, Hair Changes, Heat Intolerance, Hot flashes and New Diabetes. Hematology Not Present- Blood Thinners, Easy Bruising, Excessive bleeding, Gland  problems, HIV and Persistent Infections.  Vitals  Weight: 166.2 lb Height: 69in Body Surface Area: 1.91 m Body Mass Index: 24.54 kg/m  Temp.: 75F  Pulse: 96 (Regular)  BP: 124/82 (Sitting, Left Arm, Standard)        Physical Exam  The physical exam findings are as follows: Note:Constitutional: WDWN in NAD, conversant, no obvious deformities; Eyes: Pupils equal, round; sclera anicteric; moist conjunctiva; no lid lag HENT: Oral mucosa moist; good dentition Neck: No masses palpated, trachea midline; no thyromegaly Breasts: No axillary lymphadenopathy on either side, bilateral fibrocystic changes, no dominant masses on either side, no nipple retraction or discharge. Patient does have slight bruising on the left side from her recent biopsy. 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 The surgical procedure has been discussed with the patient. Potential risks, benefits, alternative treatments, and expected outcomes have been explained. All of the patient's questions at this time have been answered. The likelihood of reaching the patient's treatment goal is good. The patient understand the proposed surgical procedure and wishes to proceed.  Imogene Burn. Georgette Dover, MD, [facility]  General/ Trauma Surgery   05/15/2020 9:18 AM

## 2020-05-15 NOTE — Transfer of Care (Signed)
Immediate Anesthesia Transfer of Care Note  Patient: [name]  Procedure(s) Performed: BILATERAL BREAST LUMPECTOMY WITH RADIOACTIVE SEED LOCALIZATION (Bilateral Breast)  Patient Location: PACU  Anesthesia Type:General  Level of Consciousness: sedated  Airway & Oxygen Therapy: Patient Spontanous Breathing and Patient connected to nasal cannula oxygen  Post-op Assessment: Report given to RN and Post -op Vital signs reviewed and stable  Post vital signs: Reviewed and stable  Last Vitals:  Vitals Value Taken Time  BP 106/68 05/15/20 1156  Temp    Pulse 87 05/15/20 1158  Resp 13 05/15/20 1158  SpO2 100 % 05/15/20 1158  Vitals shown include unvalidated device data.  Last Pain:  Vitals:   05/15/20 0951  TempSrc: Oral  PainSc: 0-No pain         Complications: No complications documented.

## 2020-05-15 NOTE — Discharge Instructions (Signed)
Next dose of tylenol and ibuprofen at 6:15 PM  [employer] Phone Number [phone]  BREAST BIOPSY/ PARTIAL MASTECTOMY: POST OP INSTRUCTIONS  Always review your discharge instruction sheet given to you by the facility where your surgery was performed.  IF YOU HAVE DISABILITY OR FAMILY LEAVE FORMS, YOU MUST BRING THEM TO THE OFFICE FOR PROCESSING.  DO NOT GIVE THEM TO YOUR DOCTOR.  1. A prescription for pain medication may be given to you upon discharge.  Take your pain medication as prescribed, if needed.  If narcotic pain medicine is not needed, then you may take acetaminophen (Tylenol) or ibuprofen (Advil) as needed. 2. Take your usually prescribed medications unless otherwise directed 3. If you need a refill on your pain medication, please contact your pharmacy.  They will contact our office to request authorization.  Prescriptions will not be filled after 5pm or on week-ends. 4. You should eat very light the first 24 hours after surgery, such as soup, crackers, pudding, etc.  Resume your normal diet the day after surgery. 5. Most patients will experience some swelling and bruising in the breast.  Ice packs and a good support bra will help.  Swelling and bruising can take several days to resolve.  6. It is common to experience some constipation if taking pain medication after surgery.  Increasing fluid intake and taking a stool softener will usually help or prevent this problem from occurring.  A mild laxative (Milk of Magnesia or Miralax) should be taken according to package directions if there are no bowel movements after 48 hours. 7. Unless discharge instructions indicate otherwise, you may remove your bandages 24-48 hours after surgery, and you may shower at that time.  You may have steri-strips (small skin tapes) in place directly over the incision.  These strips should be left on the skin for 7-10 days.  If your surgeon used skin glue on the incision, you may shower  in 24 hours.  The glue will flake off over the next 2-3 weeks.  Any sutures or staples will be removed at the office during your follow-up visit. 8. ACTIVITIES:  You may resume regular daily activities (gradually increasing) beginning the next day.  Wearing a good support bra or sports bra minimizes pain and swelling.  You may have sexual intercourse when it is comfortable. a. You may drive when you no longer are taking prescription pain medication, you can comfortably wear a seatbelt, and you can safely maneuver your car and apply brakes. b. RETURN TO WORK:  ______________________________________________________________________________________ 9. You should see your doctor in the office for a follow-up appointment approximately two weeks after your surgery.  Your doctor's nurse will typically make your follow-up appointment when she calls you with your pathology report.  Expect your pathology report 2-3 business days after your surgery.  You may call to check if you do not hear from [place] after three days. 10. OTHER INSTRUCTIONS: _______________________________________________________________________________________________ _____________________________________________________________________________________________________________________________________ _____________________________________________________________________________________________________________________________________ _____________________________________________________________________________________________________________________________________  WHEN TO CALL YOUR DOCTOR: 1. Fever over 101.0 2. Nausea and/or vomiting. 3. Extreme swelling or bruising. 4. Continued bleeding from incision. 5. Increased pain, redness, or drainage from the incision.  The clinic staff is available to answer your questions during regular business hours.  Please don't hesitate to call and ask to speak to one of the nurses for clinical concerns.  If you  have a medical emergency, go to the nearest emergency room or call 911.  A surgeon from [facility] is always on call at  the hospital.  For further questions, please visit [email or web address]     Post Anesthesia Home Care Instructions  Activity: Get plenty of rest for the remainder of the day. A responsible individual must stay with you for 24 hours following the procedure.  For the next 24 hours, DO NOT: -Drive a car -Paediatric nurse -Drink alcoholic beverages -Take any medication unless instructed by your physician -Make any legal decisions or sign important papers.  Meals: Start with liquid foods such as gelatin or soup. Progress to regular foods as tolerated. Avoid greasy, spicy, heavy foods. If nausea and/or vomiting occur, drink only clear liquids until the nausea and/or vomiting subsides. Call your physician if vomiting continues.  Special Instructions/Symptoms: Your throat may feel dry or sore from the anesthesia or the breathing tube placed in your throat during surgery. If this causes discomfort, gargle with warm salt water. The discomfort should disappear within 24 hours.  If you had a scopolamine patch placed behind your ear for the management of post- operative nausea and/or vomiting:  1. The medication in the patch is effective for 72 hours, after which it should be removed.  Wrap patch in a tissue and discard in the trash. Wash hands thoroughly with soap and water. 2. You may remove the patch earlier than 72 hours if you experience unpleasant side effects which may include dry mouth, dizziness or visual disturbances. 3. Avoid touching the patch. Wash your hands with soap and water after contact with the patch.

## 2020-05-15 NOTE — Op Note (Signed)
Pre-op Diagnosis:  Bilateral intraductal papillomas Post-op Diagnosis: same Procedure:  Bilateral radioactive seed localized lumpectomies Surgeon:  Jahziel Sinn K. Anesthesia:  GEN - LMA Indications:  This is a 44 year old female with no significant past medical history who presents with abnormal bilateral screening mammograms. She had bilateral findings near the areola on each side. Both of these were biopsied and revealed signs of intraductal papilloma with no sign of malignancy. The patient was asymptomatic prior to her screening mammograms. The one on the right is located at 9:00 just outside the areola. The one on the left is located at 3:00 just outside the areola. She is accompanied by her husband today. He is a family Engineer, petroleum at [employer].  Description of procedure: The patient is brought to the operating room placed in supine position on the operating room table. After an adequate level of general anesthesia was obtained, her entire chest was prepped with ChloraPrep and draped in sterile fashion. A timeout was taken to ensure the proper patient and proper procedure.   We began on her right side.  We interrogated the breast with the neoprobe. We made a circumareolar incision around the lateral side of the nipple after infiltrating with 0.25% Marcaine. Dissection was carried down in the breast tissue with cautery. She has multiple small cysts that were encountered during the dissection. We used the neoprobe to guide [place] towards the radioactive seed. We excised an area of tissue around the radioactive seed 1.5 cm in diameter. The specimen was removed and was oriented with a paint kit. Specimen mammogram showed the radioactive seed as well as the biopsy clip within the specimen. This was sent for pathologic examination. There is no residual radioactivity within the biopsy cavity. We inspected carefully for hemostasis. The wound was thoroughly irrigated. The wound was closed  with a deep layer of 3-0 Vicryl and a subcuticular layer of 4-0 Monocryl.  We then turned our attention to the left side.  We interrogated the left breast with the neoprobe. We made a lateral circumareolar incision around the side of the nipple after infiltrating with 0.25% Marcaine. Dissection was carried down in the breast tissue with cautery. She has multiple small cysts that were encountered during the dissection.  We used the neoprobe to guide [place] towards the radioactive seed. We excised an area of tissue around the radioactive seed 1.5 cm in diameter. The specimen was removed and was oriented with a paint kit. Specimen mammogram showed the radioactive seed as well as the biopsy clip within the specimen. This was sent for pathologic examination. There is no residual radioactivity within the biopsy cavity. We inspected carefully for hemostasis. The wound was thoroughly irrigated. The wound was closed with a deep layer of 3-0 Vicryl and a subcuticular layer of 4-0 Monocryl.   Benzoin Steri-Strips were applied to both incisions.  Dressings were applied The patient was then extubated and brought to the recovery room in stable condition. All sponge, instrument, and needle counts are correct.  Imogene Burn. Georgette Dover, MD, [facility]  General/ Trauma Surgery  05/15/2020 11:49 AM

## 2020-05-15 NOTE — Anesthesia Postprocedure Evaluation (Signed)
Anesthesia Post Note  Patient: [name]  Procedure(s) Performed: BILATERAL BREAST LUMPECTOMY WITH RADIOACTIVE SEED LOCALIZATION (Bilateral Breast)     Patient location during evaluation: PACU Anesthesia Type: General Level of consciousness: awake and alert Pain management: pain level controlled Vital Signs Assessment: post-procedure vital signs reviewed and stable Respiratory status: spontaneous breathing, nonlabored ventilation, respiratory function stable and patient connected to nasal cannula oxygen Cardiovascular status: blood pressure returned to baseline and stable Postop Assessment: no apparent nausea or vomiting Anesthetic complications: no   No complications documented.  Last Vitals:  Vitals:   05/15/20 1230 05/15/20 1257  BP:  115/74  Pulse: 99 94  Resp: 15 16  Temp:  37.9 C  SpO2: 99% 99%    Last Pain:  Vitals:   05/15/20 1257  TempSrc: Oral  PainSc: 0-No pain   Pain Goal:                   Barnet Glasgow

## 2020-05-16 ENCOUNTER — Encounter (HOSPITAL_BASED_OUTPATIENT_CLINIC_OR_DEPARTMENT_OTHER): Payer: Self-pay | Admitting: Surgery

## 2020-05-17 LAB — SURGICAL PATHOLOGY

## 2020-06-09 MED FILL — PARoxetine HCL 30 MG TABS: 30 | 90 days supply | Qty: 90 | Fill #2

## 2020-06-20 ENCOUNTER — Other Ambulatory Visit: Payer: Self-pay | Admitting: Family Medicine

## 2020-06-20 DIAGNOSIS — F411 Generalized anxiety disorder: Secondary | ICD-10-CM

## 2020-06-20 MED FILL — buPROPion HCL 100 MG TABS: 100 | 90 days supply | Qty: 135 | Fill #0

## 2020-06-21 ENCOUNTER — Other Ambulatory Visit: Payer: Self-pay

## 2020-06-21 DIAGNOSIS — F411 Generalized anxiety disorder: Secondary | ICD-10-CM

## 2020-06-21 NOTE — Telephone Encounter (Signed)
Received refill fax request.    Next appointment is scheduled on 07-10-2020

## 2020-06-24 ENCOUNTER — Other Ambulatory Visit: Payer: Self-pay | Admitting: Family Medicine

## 2020-06-24 MED ORDER — BUPROPION HCL 100 MG PO TABS: ORAL_TABLET | ORAL | 1 refills | Status: DC

## 2020-07-10 ENCOUNTER — Ambulatory Visit (INDEPENDENT_AMBULATORY_CARE_PROVIDER_SITE_OTHER): Payer: 59 | Admitting: Family Medicine

## 2020-07-10 ENCOUNTER — Other Ambulatory Visit: Payer: Self-pay | Admitting: Family Medicine

## 2020-07-10 ENCOUNTER — Encounter: Payer: Self-pay | Admitting: Family Medicine

## 2020-07-10 ENCOUNTER — Other Ambulatory Visit: Payer: Self-pay

## 2020-07-10 VITALS — BP 98/68 | HR 71 | Temp 98.6°F | Ht 69.02 in | Wt 162.8 lb

## 2020-07-10 DIAGNOSIS — F411 Generalized anxiety disorder: Secondary | ICD-10-CM

## 2020-07-10 MED ORDER — FLUTICASONE PROPIONATE 50 MCG/ACT NA SUSP: NASAL | 11 refills | Status: DC

## 2020-07-10 MED ORDER — OMEPRAZOLE 20 MG PO CPDR
20.0000 mg | DELAYED_RELEASE_CAPSULE | Freq: Every day | ORAL | 3 refills | Status: DC
Start: 2020-07-10 — End: 2020-08-02

## 2020-07-10 MED ORDER — BUPROPION HCL 100 MG PO TABS: 100.0000 mg | ORAL_TABLET | Freq: Every day | ORAL | 1 refills | Status: DC

## 2020-07-10 MED ORDER — LORAZEPAM 1 MG PO TABS: 1.0000 mg | ORAL_TABLET | Freq: Two times a day (BID) | ORAL | 1 refills | Status: DC | PRN

## 2020-07-10 MED ORDER — PAROXETINE HCL 30 MG PO TABS: 30.0000 mg | ORAL_TABLET | Freq: Every day | ORAL | 3 refills | Status: DC

## 2020-07-10 MED FILL — LORazepam 1 MG TABS: 1 | 90 days supply | Qty: 180 | Fill #0

## 2020-07-10 MED FILL — OMEPRAZOLE 20 MG CAP: 20 | 30 days supply | Qty: 30 | Fill #0

## 2020-07-10 NOTE — Progress Notes (Signed)
This visit occurred during the SARS-CoV-2 public health emergency.  Safety protocols were in place, including screening questions prior to the visit, additional usage of staff PPE, and extensive cleaning of exam room while observing appropriate contact time as indicated for disinfecting solutions.    [name] , 05/05/1976, 44 y.o., female MRN: [id number] Patient Care Team    Relationship Specialty Notifications Start End  Ma Hillock, DO PCP - General Family Medicine  08/28/15     Chief Complaint  Patient presents with  . Follow-up  . Medication question    wellbutrin, Previcid, Paxil     Subjective: Pt presents for an OV for [facility] follow up Generalized anxiety disorder:  Patient reports for follow-up on her anxiety disorder. She reports she is doing well on the Wellbutrin and Paxil.  She does use her Ativan on occasions. She has suffered from panic attacks in the past, and reserves the Ativan use only during a potential panic attack.   Depression screen [facility] 2/9 07/10/2020 10/04/2018 06/29/2017 07/07/2016  Decreased Interest 0 0 0 0  Down, Depressed, Hopeless 0 0 0 0  PHQ - 2 Score 0 0 0 0  Altered sleeping - 0 0 -  Tired, decreased energy - 0 0 -  Change in appetite - 0 0 -  Feeling bad or failure about yourself  - 0 0 -  Trouble concentrating - 0 0 -  Moving slowly or fidgety/restless - 0 0 -  Suicidal thoughts - 0 0 -  PHQ-9 Score - 0 0 -  Difficult doing work/chores - Not difficult at all - -   GAD 7 : Generalized Anxiety Score 07/10/2020 05/29/2019 10/04/2018  Nervous, Anxious, on Edge 1 1 0  Control/stop worrying 0 0 0  Worry too much - different things 0 0 0  Trouble relaxing 1 0 0  Restless 0 0 0  Easily annoyed or irritable 0 0 0  Afraid - awful might happen 0 0 0  Total GAD 7 Score 2 1 0  Anxiety Difficulty Not difficult at all Not difficult at all Not difficult at all    Allergies  Allergen Reactions  . Ciprofloxacin Other (See Comments)      GI upset   Social History   Social History Narrative  . Not on file   Past Medical History:  Diagnosis Date  . ALLERGIC RHINITIS 11/21/2010  . Allergy    seasonal  . ANXIETY DEPRESSION 11/21/2010  . Asthma   . Dehydration 06/14/2012  . GERD (gastroesophageal reflux disease)   . Mastitis 06/14/2012  . Other specified anemias 11/21/2010  . Unspecified asthma(493.90) 03/18/2013   Past Surgical History:  Procedure Laterality Date  . BREAST LUMPECTOMY WITH RADIOACTIVE SEED LOCALIZATION Bilateral 05/15/2020   Procedure: BILATERAL BREAST LUMPECTOMY WITH RADIOACTIVE SEED LOCALIZATION;  Surgeon: Donnie Mesa, MD;  Location: [place];  Service: General;  Laterality: Bilateral;  . NO PAST SURGERIES     Family History  Problem Relation Age of Onset  . Hyperlipidemia Mother   . Osteoarthritis Mother   . Diabetes Father        Type 2  . Ulcers Father   . Migraines Sister   . Bipolar disorder Brother   . Panic disorder Brother   . Arthritis Maternal Grandmother   . Cancer Maternal Grandmother        uterine  . Cancer Maternal Grandfather 77       colon  . Heart disease Maternal  Grandfather   . Heart attack Maternal Grandfather   . Diabetes Paternal Grandmother   . Hypertension Paternal Grandmother   . Cancer Paternal Grandfather        multiple myeloma  . Breast cancer Maternal Aunt   . Anesthesia problems Neg Hx   . Hypotension Neg Hx   . Malignant hyperthermia Neg Hx   . Pseudochol deficiency Neg Hx    Allergies as of 07/10/2020      Reactions   Ciprofloxacin Other (See Comments)   GI upset      Medication List       Accurate as of July 10, 2020 11:59 PM. If you have any questions, ask your nurse or doctor.        STOP taking these medications   albuterol 108 (90 Base) MCG/ACT inhaler Commonly known as: ProAir HFA Stopped by: Howard Pouch, DO   HYDROcodone-acetaminophen 5-325 MG tablet Commonly known as: NORCO/VICODIN Stopped by: Howard Pouch,  DO   triamcinolone cream 0.1 % Commonly known as: KENALOG Stopped by: Howard Pouch, DO     TAKE these medications   buPROPion 100 MG tablet Commonly known as: WELLBUTRIN Take 1 tablet (100 mg total) by mouth daily. What changed:   how much to take  how to take this  when to take this  additional instructions Changed by: Howard Pouch, DO   fluticasone 50 MCG/ACT nasal spray Commonly known as: FLONASE INHALE 2 SPRAYS INTO EACH NOSTRIL DAILY   lansoprazole 15 MG capsule Commonly known as: PREVACID Take 15 mg by mouth daily at 12 noon.   LORazepam 1 MG tablet Commonly known as: ATIVAN Take 1 tablet (1 mg total) by mouth 2 (two) times daily as needed for anxiety.   multivitamin tablet Take 1 tablet by mouth daily.   omeprazole 20 MG capsule Commonly known as: PRILOSEC Take 1 capsule (20 mg total) by mouth daily. Started by: Howard Pouch, DO   PARoxetine 30 MG tablet Commonly known as: PAXIL Take 1 tablet (30 mg total) by mouth daily.   PreviDent 5000 Booster Plus 1.1 % Pste Generic drug: Sodium Fluoride       All past medical history, surgical history, allergies, family history, immunizations andmedications were updated in the EMR today and reviewed under the history and medication portions of their EMR.     ROS: Negative, with the exception of above mentioned in HPI   Objective:  BP 98/68 (BP Location: Left Arm, Patient Position: Sitting)   Pulse 71   Temp 98.6 F (37 C)   Ht 5' 9.02" (1.753 m)   Wt 162 lb 12.8 oz (73.8 kg)   SpO2 96%   BMI 24.03 kg/m  Body mass index is 24.03 kg/m. Gen: Afebrile. No acute distress. Nontoxic in appearance, well developed, well nourished.  HENT: AT. [place]. Eyes:Pupils Equal Round Reactive to light, Extraocular movements intact,  Conjunctiva without redness, discharge or icterus. Neuro:  Normal gait. PERLA. EOMi. Alert. Oriented x3  Psych: Normal affect, dress and demeanor. Normal speech. Normal thought content and  judgment.  No exam data present No results found. No results found for this or any previous visit (from the past 24 hour(s)).  Assessment/Plan: [name] is a 44 y.o. female present for OV for  Generalized anxiety disorder Stable. Continue Paxil 30 mg daily. Continue Wellbutrin 100 mg daily. Continue Ativan 1 mg twice daily as needed.  [place] controlled substance database reviewed today. Follow-up 5.5 months-can be yearly physical   Reviewed expectations  re: course of current medical issues.  Discussed self-management of symptoms.  Outlined signs and symptoms indicating need for more acute intervention.  Patient verbalized understanding and all questions were answered.  Patient received an After-Visit Summary.    No orders of the defined types were placed in this encounter.  Meds ordered this encounter  Medications  . PARoxetine (PAXIL) 30 MG tablet    Sig: Take 1 tablet (30 mg total) by mouth daily.    Dispense:  90 tablet    Refill:  3  . buPROPion (WELLBUTRIN) 100 MG tablet    Sig: Take 1 tablet (100 mg total) by mouth daily.    Dispense:  90 tablet    Refill:  1  . LORazepam (ATIVAN) 1 MG tablet    Sig: Take 1 tablet (1 mg total) by mouth 2 (two) times daily as needed for anxiety.    Dispense:  180 tablet    Refill:  1  . omeprazole (PRILOSEC) 20 MG capsule    Sig: Take 1 capsule (20 mg total) by mouth daily.    Dispense:  30 capsule    Refill:  3  . fluticasone (FLONASE) 50 MCG/ACT nasal spray    Sig: INHALE 2 SPRAYS INTO EACH NOSTRIL DAILY    Dispense:  16 g    Refill:  11    Hold until pt request   Referral Orders  No referral(s) requested today     Note is dictated utilizing voice recognition software. Although note has been proof read prior to signing, occasional typographical errors still can be missed. If any questions arise, please do not hesitate to call for verification.   electronically signed by:  Howard Pouch, DO   [place]

## 2020-07-10 NOTE — Patient Instructions (Addendum)
Great to see you today.  Next appt 5.5  months.

## 2020-08-02 ENCOUNTER — Telehealth: Payer: Self-pay

## 2020-08-02 ENCOUNTER — Other Ambulatory Visit: Payer: Self-pay | Admitting: Family Medicine

## 2020-08-02 MED ORDER — OMEPRAZOLE 20 MG PO CPDR
20.0000 mg | DELAYED_RELEASE_CAPSULE | Freq: Two times a day (BID) | ORAL | 5 refills | Status: DC
Start: 2020-08-02 — End: 2020-08-02

## 2020-08-02 NOTE — Telephone Encounter (Signed)
Completed. Please make pt aware

## 2020-08-02 NOTE — Telephone Encounter (Signed)
Patient would like to increase to 2 tabs daily.  Please change dosage.  omeprazole (PRILOSEC) 20 MG capsule [[id number]]   1 tab 2 times daily # 60    Please call Joellen [place] [phone] for questions.

## 2020-08-02 NOTE — Telephone Encounter (Signed)
RF request for omeprazole (PRILOSEC) 20 MG capsule [[id number]]  LOV:07/10/20 Next ov: n/a Last written: 07/10/20   Pt requesting for medication to be changed to 2 tabs daily

## 2020-08-05 MED FILL — OMEPRAZOLE 20 MG CAP: 20 | 30 days supply | Qty: 60 | Fill #0

## 2020-08-31 MED FILL — PARoxetine HCL 30 MG TABS: 30 | 90 days supply | Qty: 90 | Fill #3

## 2020-09-13 MED FILL — OMEPRAZOLE 20 MG CAP: 20 | 30 days supply | Qty: 60 | Fill #0

## 2020-10-12 MED FILL — OMEPRAZOLE 20 MG CAP: 20 | 30 days supply | Qty: 60 | Fill #1

## 2020-10-31 MED FILL — buPROPion HCL 100 MG TABS: 100 | 90 days supply | Qty: 135 | Fill #0

## 2020-11-01 ENCOUNTER — Other Ambulatory Visit: Payer: Self-pay

## 2020-11-01 DIAGNOSIS — F411 Generalized anxiety disorder: Secondary | ICD-10-CM

## 2020-11-18 MED FILL — OMEPRAZOLE 20 MG CAP: 20 | 30 days supply | Qty: 60 | Fill #2

## 2020-11-25 ENCOUNTER — Encounter: Payer: Self-pay | Admitting: Family Medicine

## 2020-11-25 ENCOUNTER — Telehealth (INDEPENDENT_AMBULATORY_CARE_PROVIDER_SITE_OTHER): Payer: 59 | Admitting: Family Medicine

## 2020-11-25 ENCOUNTER — Telehealth: Payer: Self-pay | Admitting: Family Medicine

## 2020-11-25 DIAGNOSIS — R112 Nausea with vomiting, unspecified: Secondary | ICD-10-CM

## 2020-11-25 DIAGNOSIS — U071 COVID-19: Secondary | ICD-10-CM

## 2020-11-25 MED ORDER — CEFDINIR 300 MG PO CAPS
300.0000 mg | ORAL_CAPSULE | Freq: Two times a day (BID) | ORAL | 0 refills | Status: DC
Start: 2020-11-25 — End: 2021-05-14

## 2020-11-25 MED ORDER — HYDROXYZINE HCL 10 MG PO TABS: 10.0000 mg | ORAL_TABLET | Freq: Two times a day (BID) | ORAL | 1 refills | Status: DC | PRN

## 2020-11-25 MED ORDER — PROMETHAZINE HCL 25 MG RE SUPP: 25.0000 mg | Freq: Four times a day (QID) | RECTAL | 0 refills | Status: DC | PRN

## 2020-11-25 MED ORDER — ONDANSETRON 4 MG PO TBDP
4.0000 mg | ORAL_TABLET | Freq: Three times a day (TID) | ORAL | 0 refills | Status: DC | PRN
Start: 2020-11-25 — End: 2021-05-14

## 2020-11-25 MED ORDER — HYDROCODONE-ACETAMINOPHEN 5-325 MG PO TABS: 1.0000 | ORAL_TABLET | Freq: Four times a day (QID) | ORAL | 0 refills | Status: DC | PRN

## 2020-11-25 NOTE — Progress Notes (Signed)
VIRTUAL VISIT VIA VIDEO  I connected with [name] on 11/25/20 at  9:30 AM EST by elemedicine application and verified that I am speaking with the correct person using two identifiers. Location patient: Home Location provider: [facility], Office Persons participating in the virtual visit: Patient, Dr. Raoul Pitch and Samul Dada, CMA  I discussed the limitations of evaluation and management by telemedicine and the availability of in person appointments. The patient expressed understanding and agreed to proceed.   SUBJECTIVE Chief Complaint  Patient presents with  . Covid Exposure    Pt c/o HA, chills, n/v/d, fatigue, loss of appetite x 11 days; Pt husband tested pos Friday with COVID    HPI: [name] is a 45 y.o. female present for covid illness. Her husband test positive 3 days ago. Her children were ill last week, but have recovered quickly. Her symptoms started Dec. 30th- headx, body aches, cough that was dry at the time. Her ysmptoms were mild and tolerable until Friday she started to experience rather severe Nausea and vomit. She became dehydrated. Her GI symptoms came on rather quickly and are severe. She reports pain in her left upper posterior neck that she feels maybe from vomiting, but feels like the pain she had when she had in impacted tooth as well. She denies ear pain, or pain over mastoid. She endorses chills, now productive cough, loss of taste/smell/appetite and extreme fatigue. She is now taking phenergan and it has allowed her to tolerate PO.   ROS: See pertinent positives and negatives per HPI.  Patient Active Problem List   Diagnosis Date Noted  . Abnormal mammogram of both breasts 02/08/2020  . Vitamin D deficiency 08/28/2015  . Generalized anxiety disorder 08/28/2015  . Asthma 03/18/2013  . ALLERGIC RHINITIS 11/21/2010    Social History   Tobacco Use  . Smoking status: Never Smoker  . Smokeless tobacco: Never Used  Substance  Use Topics  . Alcohol use: Yes    Alcohol/week: 0.0 standard drinks    Comment: rare occasional glass of wine    Current Outpatient Medications:  .  buPROPion (WELLBUTRIN) 100 MG tablet, Take 1 tablet (100 mg total) by mouth daily., Disp: 90 tablet, Rfl: 1 .  cefdinir (OMNICEF) 300 MG capsule, Take 1 capsule (300 mg total) by mouth 2 (two) times daily., Disp: 20 capsule, Rfl: 0 .  fluticasone (FLONASE) 50 MCG/ACT nasal spray, INHALE 2 SPRAYS INTO EACH NOSTRIL DAILY, Disp: 16 g, Rfl: 11 .  HYDROcodone-acetaminophen (NORCO/VICODIN) 5-325 MG tablet, Take 1 tablet by mouth every 6 (six) hours as needed for moderate pain., Disp: 20 tablet, Rfl: 0 .  hydrOXYzine (ATARAX/VISTARIL) 10 MG tablet, Take 1-5 tablets (10-50 mg total) by mouth 2 (two) times daily as needed., Disp: 60 tablet, Rfl: 1 .  LORazepam (ATIVAN) 1 MG tablet, Take 1 tablet (1 mg total) by mouth 2 (two) times daily as needed for anxiety., Disp: 180 tablet, Rfl: 1 .  omeprazole (PRILOSEC) 20 MG capsule, Take 1 capsule (20 mg total) by mouth 2 (two) times daily before a meal., Disp: 60 capsule, Rfl: 5 .  ondansetron (ZOFRAN ODT) 4 MG disintegrating tablet, Take 1 tablet (4 mg total) by mouth every 8 (eight) hours as needed for nausea or vomiting., Disp: 20 tablet, Rfl: 0 .  PARoxetine (PAXIL) 30 MG tablet, Take 1 tablet (30 mg total) by mouth daily., Disp: 90 tablet, Rfl: 3 .  PREVIDENT 5000 BOOSTER PLUS 1.1 % PSTE, , Disp: , Rfl:  .  promethazine (PHENERGAN) 25 MG suppository, Place 1 suppository (25 mg total) rectally every 6 (six) hours as needed for nausea or vomiting., Disp: 12 each, Rfl: 0  Allergies  Allergen Reactions  . Ciprofloxacin Other (See Comments)    GI upset    OBJECTIVE: There were no vitals taken for this visit. Gen: No acute distress. Appears pale and ill.  HENT: AT. [place]. Tacky mucous membranes Eyes:Pupils Equal Round Reactive to light, Extraocular movements intact,  Conjunctiva without redness, discharge or  icterus Chest: Cough or shortness of breath not present.  Skin: no rashes, purpura or petechiae. No redness or swelling over area of neck that is tender.  Neuro:Alert. Oriented x3  Psych: Normal affect and demeanor. Normal speech. Normal thought content and judgment.  ASSESSMENT AND PLAN: [name] is a 45 y.o. female present for  COVID-19/Nausea and vomiting, intractability of vomiting not specified, unspecified vomiting type/neck pain Rest, hydrate.  mucinex (DM if cough), nettie pot or nasal saline.  Omnicef prescribed, take until completed. Vistaril and Vicodin prescribed. > if neck pain persist or dizziness, would encourage ED evaluation- ?clot, more likely from violently vomiting.  If unable to continue to replace fluids> will need IVF replacement.  Phenergan and zofran ODT prescribed.  If cough present it can last up to 6-8 weeks.  F/U 2 weeks of not improved.    Howard Pouch, DO 11/25/2020   Return in about 1 week (around 12/02/2020), or if symptoms worsen or fail to improve.  No orders of the defined types were placed in this encounter.  Meds ordered this encounter  Medications  . cefdinir (OMNICEF) 300 MG capsule    Sig: Take 1 capsule (300 mg total) by mouth 2 (two) times daily.    Dispense:  20 capsule    Refill:  0  . hydrOXYzine (ATARAX/VISTARIL) 10 MG tablet    Sig: Take 1-5 tablets (10-50 mg total) by mouth 2 (two) times daily as needed.    Dispense:  60 tablet    Refill:  1  . HYDROcodone-acetaminophen (NORCO/VICODIN) 5-325 MG tablet    Sig: Take 1 tablet by mouth every 6 (six) hours as needed for moderate pain.    Dispense:  20 tablet    Refill:  0   Referral Orders  No referral(s) requested today

## 2020-11-25 NOTE — Telephone Encounter (Signed)
Called phenergan sup and zofran ODT per request.

## 2020-12-06 MED FILL — PARoxetine HCL 30 MG TABS: 30 | 90 days supply | Qty: 90 | Fill #0

## 2020-12-19 MED FILL — OMEPRAZOLE 20 MG CAP: 20 | 30 days supply | Qty: 60 | Fill #3

## 2021-01-15 MED FILL — OMEPRAZOLE 20 MG CAP: 20 | 30 days supply | Qty: 60 | Fill #4

## 2021-01-21 ENCOUNTER — Other Ambulatory Visit: Payer: Self-pay | Admitting: Family Medicine

## 2021-02-06 ENCOUNTER — Other Ambulatory Visit (HOSPITAL_BASED_OUTPATIENT_CLINIC_OR_DEPARTMENT_OTHER): Payer: Self-pay

## 2021-02-16 ENCOUNTER — Other Ambulatory Visit: Payer: Self-pay | Admitting: Family Medicine

## 2021-02-17 ENCOUNTER — Other Ambulatory Visit (HOSPITAL_COMMUNITY): Payer: Self-pay

## 2021-02-17 MED ORDER — OMEPRAZOLE 20 MG PO CPDR
DELAYED_RELEASE_CAPSULE | ORAL | 0 refills | Status: DC
  Filled 2021-02-17: qty 60, 30d supply, fill #0

## 2021-02-20 ENCOUNTER — Other Ambulatory Visit (HOSPITAL_COMMUNITY): Payer: Self-pay

## 2021-02-20 MED ORDER — BUPROPION HCL 100 MG PO TABS
100.0000 mg | ORAL_TABLET | Freq: Every day | ORAL | 1 refills | Status: DC
Start: 2020-07-10 — End: 2021-02-21

## 2021-02-21 ENCOUNTER — Other Ambulatory Visit (HOSPITAL_COMMUNITY): Payer: Self-pay

## 2021-02-24 ENCOUNTER — Other Ambulatory Visit (HOSPITAL_COMMUNITY): Payer: Self-pay

## 2021-02-27 ENCOUNTER — Other Ambulatory Visit (HOSPITAL_COMMUNITY): Payer: Self-pay

## 2021-03-03 ENCOUNTER — Other Ambulatory Visit (HOSPITAL_COMMUNITY): Payer: Self-pay

## 2021-03-03 MED FILL — Paroxetine HCl Tab 30 MG: ORAL | 90 days supply | Qty: 90 | Fill #0 | Status: AC

## 2021-03-17 ENCOUNTER — Other Ambulatory Visit (HOSPITAL_COMMUNITY): Payer: Self-pay

## 2021-03-17 MED FILL — Bupropion HCl Tab 100 MG: ORAL | 90 days supply | Qty: 135 | Fill #0 | Status: AC

## 2021-04-07 ENCOUNTER — Other Ambulatory Visit: Payer: Self-pay | Admitting: Family Medicine

## 2021-04-08 ENCOUNTER — Other Ambulatory Visit (HOSPITAL_COMMUNITY): Payer: Self-pay

## 2021-05-14 ENCOUNTER — Other Ambulatory Visit: Payer: Self-pay

## 2021-05-14 ENCOUNTER — Ambulatory Visit: Payer: 59 | Admitting: Family Medicine

## 2021-05-14 ENCOUNTER — Other Ambulatory Visit (HOSPITAL_COMMUNITY): Payer: Self-pay

## 2021-05-14 ENCOUNTER — Encounter: Payer: Self-pay | Admitting: Family Medicine

## 2021-05-14 VITALS — BP 100/69 | HR 75 | Temp 98.2°F | Ht 69.0 in | Wt 167.0 lb

## 2021-05-14 DIAGNOSIS — Z1231 Encounter for screening mammogram for malignant neoplasm of breast: Secondary | ICD-10-CM | POA: Diagnosis not present

## 2021-05-14 DIAGNOSIS — D241 Benign neoplasm of right breast: Secondary | ICD-10-CM

## 2021-05-14 DIAGNOSIS — F411 Generalized anxiety disorder: Secondary | ICD-10-CM

## 2021-05-14 DIAGNOSIS — D242 Benign neoplasm of left breast: Secondary | ICD-10-CM | POA: Diagnosis not present

## 2021-05-14 MED ORDER — OMEPRAZOLE 20 MG PO CPDR
DELAYED_RELEASE_CAPSULE | ORAL | 5 refills | Status: DC
  Filled 2021-05-14: qty 60, 30d supply, fill #0
  Filled 2021-07-14: qty 60, 30d supply, fill #1
  Filled 2021-09-14: qty 60, 30d supply, fill #2
  Filled 2021-11-07: qty 60, 30d supply, fill #3
  Filled 2022-01-10: qty 60, 30d supply, fill #4
  Filled 2022-03-13: qty 60, 30d supply, fill #5

## 2021-05-14 MED ORDER — LORAZEPAM 1 MG PO TABS
1.0000 mg | ORAL_TABLET | Freq: Two times a day (BID) | ORAL | 1 refills | Status: DC | PRN
  Filled 2021-05-14: qty 180, 90d supply, fill #0

## 2021-05-14 MED ORDER — BUPROPION HCL 100 MG PO TABS
100.0000 mg | ORAL_TABLET | Freq: Every day | ORAL | 3 refills | Status: DC
  Filled 2021-05-14 (×2): qty 90, 90d supply, fill #0
  Filled 2021-10-29: qty 90, 90d supply, fill #1
  Filled 2022-02-08: qty 90, 90d supply, fill #2
  Filled 2022-05-02: qty 90, 90d supply, fill #3

## 2021-05-14 MED ORDER — PAROXETINE HCL 30 MG PO TABS
ORAL_TABLET | Freq: Every day | ORAL | 3 refills | Status: DC
  Filled 2021-05-14: qty 90, 90d supply, fill #0
  Filled 2021-08-31: qty 90, 90d supply, fill #1
  Filled 2021-11-29: qty 90, 90d supply, fill #2
  Filled 2022-02-27: qty 90, 90d supply, fill #3

## 2021-05-14 NOTE — Patient Instructions (Signed)
Great to see you today.  We will get the order for your mammogram and they will call you  to schedule.

## 2021-05-14 NOTE — Progress Notes (Signed)
This visit occurred during the SARS-CoV-2 public health emergency.  Safety protocols were in place, including screening questions prior to the visit, additional usage of staff PPE, and extensive cleaning of exam room while observing appropriate contact time as indicated for disinfecting solutions.    [name] , July 31, 1976, 45 y.o., female MRN: [id number] Patient Care Team    Relationship Specialty Notifications Start End  Ma Hillock, DO PCP - General Family Medicine  08/28/15     Chief Complaint  Patient presents with   Anxiety    Pt is not fasting; [facility]     Subjective: Pt presents for an OV for [facility] follow up Generalized anxiety disorder:  Patient reports for follow-up on her anxiety disorder. She reports she is feeling well on Wellbutrin and Paxil.  She occasionally still uses her Ativan if needed.  She has suffered from panic attacks in the past, and reserves the Ativan use only during a potential panic attack. [place] controlled substance database reviewed.  History of intraductal papilloma of both breasts: Patient had bilateral lumpectomy last years 05/15/2020.  She is due for her routine screening mammogram for this year.   Depression screen [facility] 2/9 07/10/2020 10/04/2018 06/29/2017 07/07/2016  Decreased Interest 0 0 0 0  Down, Depressed, Hopeless 0 0 0 0  PHQ - 2 Score 0 0 0 0  Altered sleeping - 0 0 -  Tired, decreased energy - 0 0 -  Change in appetite - 0 0 -  Feeling bad or failure about yourself  - 0 0 -  Trouble concentrating - 0 0 -  Moving slowly or fidgety/restless - 0 0 -  Suicidal thoughts - 0 0 -  PHQ-9 Score - 0 0 -  Difficult doing work/chores - Not difficult at all - -   GAD 7 : Generalized Anxiety Score 07/10/2020 05/29/2019 10/04/2018  Nervous, Anxious, on Edge 1 1 0  Control/stop worrying 0 0 0  Worry too much - different things 0 0 0  Trouble relaxing 1 0 0  Restless 0 0 0  Easily annoyed or irritable 0 0 0  Afraid - awful might happen  0 0 0  Total GAD 7 Score 2 1 0  Anxiety Difficulty Not difficult at all Not difficult at all Not difficult at all    Allergies  Allergen Reactions   Ciprofloxacin Other (See Comments)    GI upset   Social History   Social History Narrative   Not on file   Past Medical History:  Diagnosis Date   ALLERGIC RHINITIS 11/21/2010   Allergy    seasonal   ANXIETY DEPRESSION 11/21/2010   Asthma    Dehydration 06/14/2012   GERD (gastroesophageal reflux disease)    Mastitis 06/14/2012   Other specified anemias 11/21/2010   Unspecified asthma(493.90) 03/18/2013   Past Surgical History:  Procedure Laterality Date   BREAST LUMPECTOMY WITH RADIOACTIVE SEED LOCALIZATION Bilateral 05/15/2020   Procedure: BILATERAL BREAST LUMPECTOMY WITH RADIOACTIVE SEED LOCALIZATION;  Surgeon: Donnie Mesa, MD;  Location: [place];  Service: General;  Laterality: Bilateral;   NO PAST SURGERIES     Family History  Problem Relation Age of Onset   Hyperlipidemia Mother    Osteoarthritis Mother    Diabetes Father        Type 2   Ulcers Father    Migraines Sister    Bipolar disorder Brother    Panic disorder Brother    Arthritis Maternal Grandmother  Cancer Maternal Grandmother        uterine   Cancer Maternal Grandfather 29       colon   Heart disease Maternal Grandfather    Heart attack Maternal Grandfather    Diabetes Paternal Grandmother    Hypertension Paternal Grandmother    Cancer Paternal Grandfather        multiple myeloma   Breast cancer Maternal Aunt    Anesthesia problems Neg Hx    Hypotension Neg Hx    Malignant hyperthermia Neg Hx    Pseudochol deficiency Neg Hx    Allergies as of 05/14/2021       Reactions   Ciprofloxacin Other (See Comments)   GI upset        Medication List        Accurate as of May 14, 2021  2:49 PM. If you have any questions, ask your nurse or doctor.          STOP taking these medications    cefdinir 300 MG capsule Commonly  known as: OMNICEF Stopped by: Howard Pouch, DO   HYDROcodone-acetaminophen 5-325 MG tablet Commonly known as: NORCO/VICODIN Stopped by: Howard Pouch, DO   hydrOXYzine 10 MG tablet Commonly known as: ATARAX/VISTARIL Stopped by: Howard Pouch, DO   ondansetron 4 MG disintegrating tablet Commonly known as: Zofran ODT Stopped by: Howard Pouch, DO   PreviDent 5000 Booster Plus 1.1 % Pste Generic drug: Sodium Fluoride Stopped by: Howard Pouch, DO   promethazine 25 MG suppository Commonly known as: Phenergan Stopped by: Howard Pouch, DO       TAKE these medications    buPROPion 100 MG tablet Commonly known as: WELLBUTRIN Take 1 tablet (100 mg total) by mouth daily. What changed: Another medication with the same name was removed. Continue taking this medication, and follow the directions you see here. Changed by: Howard Pouch, DO   fluticasone 50 MCG/ACT nasal spray Commonly known as: FLONASE INHALE 2 SPRAYS INTO EACH NOSTRIL DAILY   LORazepam 1 MG tablet Commonly known as: ATIVAN Take 1 tablet (1 mg total) by mouth 2 (two) times daily as needed for anxiety.   omeprazole 20 MG capsule Commonly known as: PRILOSEC TAKE 1 CAPSULE BY MOUTH TWO TIMES DAILY BEFORE A MEAL   PARoxetine 30 MG tablet Commonly known as: PAXIL TAKE 1 TABLET BY MOUTH ONCE DAILY        All past medical history, surgical history, allergies, family history, immunizations andmedications were updated in the EMR today and reviewed under the history and medication portions of their EMR.     ROS: Negative, with the exception of above mentioned in HPI   Objective:  BP 100/69   Pulse 75   Temp 98.2 F (36.8 C) (Oral)   Ht 5' 9"  (1.753 m)   Wt 167 lb (75.8 kg)   SpO2 97%   BMI 24.66 kg/m  Body mass index is 24.66 kg/m. Gen: Afebrile. No acute distress.  Nontoxic, very pleasant female HENT: AT. .  Eyes:Pupils Equal Round Reactive to light, Extraocular movements intact,  Conjunctiva without  redness, discharge or icterus. CV: RRR  Chest: CTAB Neuro:  Normal gait. PERLA. EOMi. Alert. Oriented x3 Psych: Normal affect, dress and demeanor. Normal speech. Normal thought content and judgment..    No results found. No results found. No results found for this or any previous visit (from the past 24 hour(s)).  Assessment/Plan: Doninique Lwin is a 45 y.o. female present for OV for  Generalized anxiety disorder Stable  Continue Paxil 30 mg daily. Continue Wellbutrin 100 mg daily. Continue Ativan 1 mg twice daily as needed.  [place] controlled substance database reviewed today Follow-up 5.5 months-  Breast cancer screening by mammogram/history of intraductal papilloma of both breasts - MM 3D SCREEN BREAST BILATERAL; Future -Discussed with patient I am uncertain if this needs to be ordered as a routine screening or a diagnostic since she is just past the 74-monthmark of her surgery.  We will place order for her and if radiology needs to change to diagnostic, we will certainly approve those orders.  Reviewed expectations re: course of current medical issues. Discussed self-management of symptoms. Outlined signs and symptoms indicating need for more acute intervention. Patient verbalized understanding and all questions were answered. Patient received an After-Visit Summary.    No orders of the defined types were placed in this encounter.  No orders of the defined types were placed in this encounter.  Referral Orders  No referral(s) requested today     Note is dictated utilizing voice recognition software. Although note has been proof read prior to signing, occasional typographical errors still can be missed. If any questions arise, please do not hesitate to call for verification.   electronically signed by:  RHoward Pouch DO  [place]

## 2021-05-15 ENCOUNTER — Other Ambulatory Visit (HOSPITAL_COMMUNITY): Payer: Self-pay

## 2021-05-16 DIAGNOSIS — D241 Benign neoplasm of right breast: Secondary | ICD-10-CM | POA: Insufficient documentation

## 2021-05-17 ENCOUNTER — Other Ambulatory Visit (HOSPITAL_COMMUNITY): Payer: Self-pay

## 2021-05-26 ENCOUNTER — Encounter (HOSPITAL_COMMUNITY): Payer: Self-pay

## 2021-05-28 ENCOUNTER — Other Ambulatory Visit (HOSPITAL_COMMUNITY): Payer: Self-pay

## 2021-06-18 DIAGNOSIS — H5213 Myopia, bilateral: Secondary | ICD-10-CM | POA: Diagnosis not present

## 2021-07-14 ENCOUNTER — Other Ambulatory Visit (HOSPITAL_COMMUNITY): Payer: Self-pay

## 2021-09-01 ENCOUNTER — Other Ambulatory Visit (HOSPITAL_COMMUNITY): Payer: Self-pay

## 2021-09-02 ENCOUNTER — Ambulatory Visit
Admission: RE | Admit: 2021-09-02 | Discharge: 2021-09-02 | Disposition: A | Discharge status: Home / Self Care | Payer: 59 | Source: Ambulatory Visit | Attending: Family Medicine | Admitting: Family Medicine

## 2021-09-02 ENCOUNTER — Other Ambulatory Visit: Payer: Self-pay

## 2021-09-02 DIAGNOSIS — D241 Benign neoplasm of right breast: Secondary | ICD-10-CM

## 2021-09-02 DIAGNOSIS — Z1231 Encounter for screening mammogram for malignant neoplasm of breast: Secondary | ICD-10-CM

## 2021-09-02 DIAGNOSIS — D242 Benign neoplasm of left breast: Secondary | ICD-10-CM

## 2021-09-08 ENCOUNTER — Other Ambulatory Visit: Payer: Self-pay | Admitting: Family Medicine

## 2021-09-08 DIAGNOSIS — R928 Other abnormal and inconclusive findings on diagnostic imaging of breast: Secondary | ICD-10-CM

## 2021-09-15 ENCOUNTER — Other Ambulatory Visit (HOSPITAL_COMMUNITY): Payer: Self-pay

## 2021-09-25 ENCOUNTER — Other Ambulatory Visit: Payer: Self-pay

## 2021-09-25 ENCOUNTER — Ambulatory Visit
Admission: RE | Admit: 2021-09-25 | Discharge: 2021-09-25 | Disposition: A | Discharge status: Home / Self Care | Payer: 59 | Source: Ambulatory Visit | Attending: Family Medicine | Admitting: Family Medicine

## 2021-09-25 ENCOUNTER — Other Ambulatory Visit: Payer: Self-pay | Admitting: Family Medicine

## 2021-09-25 DIAGNOSIS — R928 Other abnormal and inconclusive findings on diagnostic imaging of breast: Secondary | ICD-10-CM

## 2021-09-25 DIAGNOSIS — N6311 Unspecified lump in the right breast, upper outer quadrant: Secondary | ICD-10-CM | POA: Diagnosis not present

## 2021-09-25 DIAGNOSIS — N6001 Solitary cyst of right breast: Secondary | ICD-10-CM

## 2021-09-25 DIAGNOSIS — N6324 Unspecified lump in the left breast, lower inner quadrant: Secondary | ICD-10-CM | POA: Diagnosis not present

## 2021-09-25 DIAGNOSIS — N6313 Unspecified lump in the right breast, lower outer quadrant: Secondary | ICD-10-CM | POA: Diagnosis not present

## 2021-09-25 DIAGNOSIS — N6322 Unspecified lump in the left breast, upper inner quadrant: Secondary | ICD-10-CM | POA: Diagnosis not present

## 2021-09-25 DIAGNOSIS — N6002 Solitary cyst of left breast: Secondary | ICD-10-CM

## 2021-10-30 ENCOUNTER — Other Ambulatory Visit (HOSPITAL_COMMUNITY): Payer: Self-pay

## 2021-11-09 ENCOUNTER — Other Ambulatory Visit (HOSPITAL_COMMUNITY): Payer: Self-pay

## 2021-11-11 ENCOUNTER — Other Ambulatory Visit (HOSPITAL_COMMUNITY): Payer: Self-pay

## 2021-11-29 ENCOUNTER — Other Ambulatory Visit (HOSPITAL_COMMUNITY): Payer: Self-pay

## 2021-12-08 ENCOUNTER — Other Ambulatory Visit (HOSPITAL_COMMUNITY): Payer: Self-pay

## 2021-12-11 ENCOUNTER — Other Ambulatory Visit (HOSPITAL_COMMUNITY): Payer: Self-pay

## 2022-01-10 ENCOUNTER — Other Ambulatory Visit (HOSPITAL_COMMUNITY): Payer: Self-pay

## 2022-02-08 ENCOUNTER — Other Ambulatory Visit (HOSPITAL_COMMUNITY): Payer: Self-pay

## 2022-02-09 ENCOUNTER — Other Ambulatory Visit (HOSPITAL_COMMUNITY): Payer: Self-pay

## 2022-02-27 ENCOUNTER — Other Ambulatory Visit (HOSPITAL_COMMUNITY): Payer: Self-pay

## 2022-03-13 ENCOUNTER — Other Ambulatory Visit (HOSPITAL_COMMUNITY): Payer: Self-pay

## 2022-03-31 ENCOUNTER — Ambulatory Visit
Admission: RE | Admit: 2022-03-31 | Discharge: 2022-03-31 | Disposition: A | Discharge status: Home / Self Care | Payer: 59 | Source: Ambulatory Visit | Attending: Family Medicine | Admitting: Family Medicine

## 2022-03-31 ENCOUNTER — Other Ambulatory Visit: Payer: Self-pay | Admitting: Family Medicine

## 2022-03-31 DIAGNOSIS — N6002 Solitary cyst of left breast: Secondary | ICD-10-CM

## 2022-03-31 DIAGNOSIS — N6324 Unspecified lump in the left breast, lower inner quadrant: Secondary | ICD-10-CM | POA: Diagnosis not present

## 2022-03-31 DIAGNOSIS — N6001 Solitary cyst of right breast: Secondary | ICD-10-CM

## 2022-03-31 DIAGNOSIS — N6322 Unspecified lump in the left breast, upper inner quadrant: Secondary | ICD-10-CM | POA: Diagnosis not present

## 2022-03-31 DIAGNOSIS — N6311 Unspecified lump in the right breast, upper outer quadrant: Secondary | ICD-10-CM | POA: Diagnosis not present

## 2022-03-31 DIAGNOSIS — N6313 Unspecified lump in the right breast, lower outer quadrant: Secondary | ICD-10-CM | POA: Diagnosis not present

## 2022-05-02 ENCOUNTER — Other Ambulatory Visit (HOSPITAL_COMMUNITY): Payer: Self-pay

## 2022-05-02 ENCOUNTER — Other Ambulatory Visit: Payer: Self-pay | Admitting: Family Medicine

## 2022-05-04 ENCOUNTER — Other Ambulatory Visit (HOSPITAL_COMMUNITY): Payer: Self-pay

## 2022-05-05 ENCOUNTER — Other Ambulatory Visit (HOSPITAL_COMMUNITY): Payer: Self-pay

## 2022-05-05 MED ORDER — OMEPRAZOLE 20 MG PO CPDR
DELAYED_RELEASE_CAPSULE | ORAL | 0 refills | Status: DC
  Filled 2022-05-05: qty 60, 30d supply, fill #0

## 2022-05-30 ENCOUNTER — Other Ambulatory Visit: Payer: Self-pay | Admitting: Family Medicine

## 2022-05-30 DIAGNOSIS — F411 Generalized anxiety disorder: Secondary | ICD-10-CM

## 2022-06-01 ENCOUNTER — Other Ambulatory Visit (HOSPITAL_COMMUNITY): Payer: Self-pay

## 2022-06-01 MED ORDER — PAROXETINE HCL 30 MG PO TABS
ORAL_TABLET | Freq: Every day | ORAL | 0 refills | Status: DC
  Filled 2022-06-01: qty 30, 30d supply, fill #0

## 2022-07-08 ENCOUNTER — Other Ambulatory Visit (HOSPITAL_COMMUNITY): Payer: Self-pay

## 2022-07-08 ENCOUNTER — Encounter: Payer: Self-pay | Admitting: Family Medicine

## 2022-07-08 ENCOUNTER — Telehealth (INDEPENDENT_AMBULATORY_CARE_PROVIDER_SITE_OTHER): Payer: 59 | Admitting: Family Medicine

## 2022-07-08 DIAGNOSIS — F411 Generalized anxiety disorder: Secondary | ICD-10-CM

## 2022-07-08 MED ORDER — FLUTICASONE PROPIONATE 50 MCG/ACT NA SUSP
2.0000 | Freq: Two times a day (BID) | NASAL | 5 refills | Status: DC
  Filled 2022-07-08: qty 16, 30d supply, fill #0
  Filled 2023-03-31: qty 16, 30d supply, fill #1
  Filled 2023-04-29: qty 16, 30d supply, fill #2

## 2022-07-08 MED ORDER — PAROXETINE HCL 30 MG PO TABS
ORAL_TABLET | Freq: Every day | ORAL | 3 refills | Status: DC
  Filled 2022-07-08: qty 90, 90d supply, fill #0
  Filled 2022-10-02: qty 90, 90d supply, fill #1
  Filled 2022-12-22: qty 90, 90d supply, fill #2
  Filled 2023-03-21: qty 90, 90d supply, fill #3

## 2022-07-08 MED ORDER — BUPROPION HCL 100 MG PO TABS
100.0000 mg | ORAL_TABLET | Freq: Every day | ORAL | 3 refills | Status: DC
  Filled 2022-07-08 – 2022-08-07 (×2): qty 90, 90d supply, fill #0
  Filled 2022-11-01: qty 90, 90d supply, fill #1
  Filled 2023-01-28: qty 90, 90d supply, fill #2
  Filled 2023-04-29: qty 90, 90d supply, fill #3

## 2022-07-08 MED ORDER — OMEPRAZOLE 20 MG PO CPDR
20.0000 mg | DELAYED_RELEASE_CAPSULE | Freq: Every day | ORAL | 3 refills | Status: DC
Start: 2022-07-08 — End: 2023-07-27
  Filled 2022-07-08: qty 90, 90d supply, fill #0
  Filled 2022-10-02: qty 90, 90d supply, fill #1
  Filled 2023-01-01: qty 90, 90d supply, fill #2
  Filled 2023-03-31: qty 90, 90d supply, fill #3

## 2022-07-08 NOTE — Progress Notes (Unsigned)
[name] , 1976/10/25, 46 y.o., female MRN: [id number] Patient Care Team    Relationship Specialty Notifications Start End  Ma Hillock, DO PCP - General Family Medicine  08/28/15     Chief Complaint  Patient presents with   Anxiety    [facility]mc;      Subjective: Pt presents for an OV for [facility] follow up Generalized anxiety disorder:  Patient reports for follow-up on her anxiety disorder. She reports she is feeling well on Wellbutrin and Paxil.  She occasionally still uses her Ativan if needed.  She has suffered from panic attacks in the past, and reserves the Ativan use only during a potential panic attack. A controlled substance database reviewed.  History of intraductal papilloma of both breasts: Patient had bilateral lumpectomy last years 05/15/2020.  She is due for her routine screening mammogram for this year.      07/08/2022    2:47 PM 05/14/2021    3:08 PM 07/10/2020   10:30 AM 10/04/2018   10:05 AM 10/04/2018   10:04 AM  Depression screen PHQ 2/9  Decreased Interest 0 0 0  0  Down, Depressed, Hopeless 0 0 0  0  PHQ - 2 Score 0 0 0  0  Altered sleeping 0 0   0  Tired, decreased energy 0 0   0  Change in appetite 0 0   0  Feeling bad or failure about yourself  0 0   0  Trouble concentrating 0 0   0  Moving slowly or fidgety/restless 0 0   0  Suicidal thoughts 0 0   0  PHQ-9 Score 0 0   0  Difficult doing work/chores  Not difficult at all  Not difficult at all       07/08/2022    2:48 PM 05/14/2021    3:09 PM 07/10/2020   10:36 AM 05/29/2019    9:40 AM  GAD 7 : Generalized Anxiety Score  Nervous, Anxious, on Edge 0 0 1 1  Control/stop worrying 0 0 0 0  Worry too much - different things 0 0 0 0  Trouble relaxing 0 0 1 0  Restless 0 0 0 0  Easily annoyed or irritable 0 0 0 0  Afraid - awful might happen 0 0 0 0  Total GAD 7 Score 0 0 2 1  Anxiety Difficulty   Not difficult at all Not difficult at all    Allergies  Allergen Reactions    Ciprofloxacin Other (See Comments)    GI upset   Social History   Social History Narrative   Not on file   Past Medical History:  Diagnosis Date   ALLERGIC RHINITIS 11/21/2010   Allergy    seasonal   ANXIETY DEPRESSION 11/21/2010   Asthma    Dehydration 06/14/2012   GERD (gastroesophageal reflux disease)    Mastitis 06/14/2012   Other specified anemias 11/21/2010   Unspecified asthma(493.90) 03/18/2013   Past Surgical History:  Procedure Laterality Date   BREAST LUMPECTOMY WITH RADIOACTIVE SEED LOCALIZATION Bilateral 05/15/2020   Procedure: BILATERAL BREAST LUMPECTOMY WITH RADIOACTIVE SEED LOCALIZATION;  Surgeon: Donnie Mesa, MD;  Location: [place];  Service: General;  Laterality: Bilateral;   NO PAST SURGERIES     Family History  Problem Relation Age of Onset   Hyperlipidemia Mother    Osteoarthritis Mother    Diabetes Father        Type 2   Ulcers Father  Migraines Sister    Bipolar disorder Brother    Panic disorder Brother    Arthritis Maternal Grandmother    Cancer Maternal Grandmother        uterine   Cancer Maternal Grandfather 30       colon   Heart disease Maternal Grandfather    Heart attack Maternal Grandfather    Diabetes Paternal Grandmother    Hypertension Paternal Grandmother    Cancer Paternal Grandfather        multiple myeloma   Breast cancer Maternal Aunt    Anesthesia problems Neg Hx    Hypotension Neg Hx    Malignant hyperthermia Neg Hx    Pseudochol deficiency Neg Hx    Allergies as of 07/08/2022       Reactions   Ciprofloxacin Other (See Comments)   GI upset        Medication List        Accurate as of July 08, 2022  2:54 PM. If you have any questions, ask your nurse or doctor.          buPROPion 100 MG tablet Commonly known as: WELLBUTRIN Take 1 tablet (100 mg total) by mouth daily.   fluticasone 50 MCG/ACT nasal spray Commonly known as: FLONASE INHALE 2 SPRAYS INTO EACH NOSTRIL DAILY   LORazepam 1  MG tablet Commonly known as: ATIVAN Take 1 tablet (1 mg total) by mouth 2 (two) times daily as needed for anxiety.   omeprazole 20 MG capsule Commonly known as: PRILOSEC TAKE 1 CAPSULE BY MOUTH 2 TIMES DAILY BEFORE A MEAL--need office visit for further refills   PARoxetine 30 MG tablet Commonly known as: PAXIL TAKE 1 TABLET BY MOUTH ONCE DAILY        All past medical history, surgical history, allergies, family history, immunizations andmedications were updated in the EMR today and reviewed under the history and medication portions of their EMR.     ROS: Negative, with the exception of above mentioned in HPI   Objective:  There were no vitals taken for this visit. There is no height or weight on file to calculate BMI. Gen: Afebrile. No acute distress.  Nontoxic, very pleasant female HENT: AT. [place].  Eyes:Pupils Equal Round Reactive to light, Extraocular movements intact,  Conjunctiva without redness, discharge or icterus. CV: RRR  Chest: CTAB Neuro:  Normal gait. PERLA. EOMi. Alert. Oriented x3 Psych: Normal affect, dress and demeanor. Normal speech. Normal thought content and judgment..    No results found. No results found. No results found for this or any previous visit (from the past 24 hour(s)).  Assessment/Plan: [name] is a 46 y.o. female present for OV for  Generalized anxiety disorder Stable Continue Paxil 30 mg daily. Continue Wellbutrin 100 mg daily. Continue Ativan 1 mg twice daily as needed.  [place] controlled substance database reviewed today Follow-up 5.5 months-  Breast cancer screening by mammogram/history of intraductal papilloma of both breasts - MM 3D SCREEN BREAST BILATERAL; Future -Discussed with patient I am uncertain if this needs to be ordered as a routine screening or a diagnostic since she is just past the 10-monthmark of her surgery.  We will place order for her and if radiology needs to change to diagnostic, we will  certainly approve those orders.  Reviewed expectations re: course of current medical issues. Discussed self-management of symptoms. Outlined signs and symptoms indicating need for more acute intervention. Patient verbalized understanding and all questions were answered. Patient received an After-Visit Summary.  No orders of the defined types were placed in this encounter.   No orders of the defined types were placed in this encounter.   Referral Orders  No referral(s) requested today     Note is dictated utilizing voice recognition software. Although note has been proof read prior to signing, occasional typographical errors still can be missed. If any questions arise, please do not hesitate to call for verification.   electronically signed by:  Howard Pouch, DO  [place]

## 2022-07-09 ENCOUNTER — Other Ambulatory Visit (HOSPITAL_COMMUNITY): Payer: Self-pay

## 2022-07-27 ENCOUNTER — Telehealth: Payer: Self-pay | Admitting: Family Medicine

## 2022-07-27 NOTE — Telephone Encounter (Signed)
When Joellen Jersey mentioned this she stated that Dr. Raoul Pitch and her spoke about this prior.

## 2022-07-27 NOTE — Telephone Encounter (Signed)
Unfortunately we do not do labs prior to appts. Please advise pt.

## 2022-07-27 NOTE — Telephone Encounter (Signed)
[name] stated that she could come in prior to her physical on Wednesday for fasting blood work. I just wanted to let you all know so an order for the blood work can be created.  She is coming in tomorrow at 8:30 for labs.

## 2022-07-28 ENCOUNTER — Other Ambulatory Visit: Payer: 59

## 2022-07-29 ENCOUNTER — Ambulatory Visit (INDEPENDENT_AMBULATORY_CARE_PROVIDER_SITE_OTHER): Payer: 59 | Admitting: Family Medicine

## 2022-07-29 ENCOUNTER — Encounter: Payer: Self-pay | Admitting: Family Medicine

## 2022-07-29 VITALS — BP 114/77 | HR 71 | Temp 98.5°F | Ht 69.0 in | Wt 175.0 lb

## 2022-07-29 DIAGNOSIS — Z1159 Encounter for screening for other viral diseases: Secondary | ICD-10-CM | POA: Diagnosis not present

## 2022-07-29 DIAGNOSIS — Z1231 Encounter for screening mammogram for malignant neoplasm of breast: Secondary | ICD-10-CM | POA: Diagnosis not present

## 2022-07-29 DIAGNOSIS — D241 Benign neoplasm of right breast: Secondary | ICD-10-CM | POA: Diagnosis not present

## 2022-07-29 DIAGNOSIS — Z1211 Encounter for screening for malignant neoplasm of colon: Secondary | ICD-10-CM | POA: Diagnosis not present

## 2022-07-29 DIAGNOSIS — Z79899 Other long term (current) drug therapy: Secondary | ICD-10-CM

## 2022-07-29 DIAGNOSIS — Z0000 Encounter for general adult medical examination without abnormal findings: Secondary | ICD-10-CM

## 2022-07-29 DIAGNOSIS — E559 Vitamin D deficiency, unspecified: Secondary | ICD-10-CM | POA: Diagnosis not present

## 2022-07-29 DIAGNOSIS — Z23 Encounter for immunization: Secondary | ICD-10-CM | POA: Diagnosis not present

## 2022-07-29 DIAGNOSIS — D242 Benign neoplasm of left breast: Secondary | ICD-10-CM

## 2022-07-29 DIAGNOSIS — F411 Generalized anxiety disorder: Secondary | ICD-10-CM

## 2022-07-29 NOTE — Patient Instructions (Signed)
Return in about 1 year (around 07/31/2023) for cpe (20 min), Routine chronic condition follow-up.        Great to see you today.  I have refilled the medication(s) we provide.   If labs were collected, we will inform you of lab results once received either by echart message or telephone call.   - echart message- for normal results that have been seen by the patient already.   - telephone call: abnormal results or if patient has not viewed results in their echart.   Health Maintenance, Female Adopting a healthy lifestyle and getting preventive care are important in promoting health and wellness. Ask your health care provider about: The right schedule for you to have regular tests and exams. Things you can do on your own to prevent diseases and keep yourself healthy. What should I know about diet, weight, and exercise? Eat a healthy diet  Eat a diet that includes plenty of vegetables, fruits, low-fat dairy products, and lean protein. Do not eat a lot of foods that are high in solid fats, added sugars, or sodium. Maintain a healthy weight Body mass index (BMI) is used to identify weight problems. It estimates body fat based on height and weight. Your health care provider can help determine your BMI and help you achieve or maintain a healthy weight. Get regular exercise Get regular exercise. This is one of the most important things you can do for your health. Most adults should: Exercise for at least 150 minutes each week. The exercise should increase your heart rate and make you sweat (moderate-intensity exercise). Do strengthening exercises at least twice a week. This is in addition to the moderate-intensity exercise. Spend less time sitting. Even light physical activity can be beneficial. Watch cholesterol and blood lipids Have your blood tested for lipids and cholesterol at 47 years of age, then have this test every 5 years. Have your cholesterol levels checked more often if: Your  lipid or cholesterol levels are high. You are older than 47 years of age. You are at high risk for heart disease. What should I know about cancer screening? Depending on your health history and family history, you may need to have cancer screening at various ages. This may include screening for: Breast cancer. Cervical cancer. Colorectal cancer. Skin cancer. Lung cancer. What should I know about heart disease, diabetes, and high blood pressure? Blood pressure and heart disease High blood pressure causes heart disease and increases the risk of stroke. This is more likely to develop in people who have high blood pressure readings or are overweight. Have your blood pressure checked: Every 3-5 years if you are 60-27 years of age. Every year if you are 5 years old or older. Diabetes Have regular diabetes screenings. This checks your fasting blood sugar level. Have the screening done: Once every three years after age 73 if you are at a normal weight and have a low risk for diabetes. More often and at a younger age if you are overweight or have a high risk for diabetes. What should I know about preventing infection? Hepatitis B If you have a higher risk for hepatitis B, you should be screened for this virus. Talk with your health care provider to find out if you are at risk for hepatitis B infection. Hepatitis C Testing is recommended for: Everyone born from [birth year] through 1966. Anyone with known risk factors for hepatitis C. Sexually transmitted infections (STIs) Get screened for STIs, including gonorrhea and chlamydia, if: You are  sexually active and are younger than 47 years of age. You are older than 47 years of age and your health care provider tells you that you are at risk for this type of infection. Your sexual activity has changed since you were last screened, and you are at increased risk for chlamydia or gonorrhea. Ask your health care provider if you are at risk. Ask your health  care provider about whether you are at high risk for HIV. Your health care provider may recommend a prescription medicine to help prevent HIV infection. If you choose to take medicine to prevent HIV, you should first get tested for HIV. You should then be tested every 3 months for as long as you are taking the medicine. Pregnancy If you are about to stop having your period (premenopausal) and you may become pregnant, seek counseling before you get pregnant. Take 400 to 800 micrograms (mcg) of folic acid every day if you become pregnant. Ask for birth control (contraception) if you want to prevent pregnancy. Osteoporosis and menopause Osteoporosis is a disease in which the bones lose minerals and strength with aging. This can result in bone fractures. If you are 49 years old or older, or if you are at risk for osteoporosis and fractures, ask your health care provider if you should: Be screened for bone loss. Take a calcium or vitamin D supplement to lower your risk of fractures. Be given hormone replacement therapy (HRT) to treat symptoms of menopause. Follow these instructions at home: Alcohol use Do not drink alcohol if: Your health care provider tells you not to drink. You are pregnant, may be pregnant, or are planning to become pregnant. If you drink alcohol: Limit how much you have to: 0-1 drink a day. Know how much alcohol is in your drink. In the [place] one drink equals one 12 oz bottle of beer (355 mL), one 5 oz glass of wine (148 mL), or one 1 oz glass of hard liquor (44 mL). Lifestyle Do not use any products that contain nicotine or tobacco. These products include cigarettes, chewing tobacco, and vaping devices, such as e-cigarettes. If you need help quitting, ask your health care provider. Do not use street drugs. Do not share needles. Ask your health care provider for help if you need support or information about quitting drugs. General instructions Schedule regular health,  dental, and eye exams. Stay current with your vaccines. Tell your health care provider if: You often feel depressed. You have ever been abused or do not feel safe at home. Summary Adopting a healthy lifestyle and getting preventive care are important in promoting health and wellness. Follow your health care provider's instructions about healthy diet, exercising, and getting tested or screened for diseases. Follow your health care provider's instructions on monitoring your cholesterol and blood pressure. This information is not intended to replace advice given to you by your health care provider. Make sure you discuss any questions you have with your health care provider. Document Revised: 03/24/2021 Document Reviewed: 03/24/2021 Elsevier Patient Education  [place].

## 2022-07-29 NOTE — Progress Notes (Signed)
[name], female  DOB: [birth year], 46 y.o.   MRN: [id number] Patient Care Team    Relationship Specialty Notifications Start End  Ma Hillock, DO PCP - General Family Medicine  08/28/15     Chief Complaint  Patient presents with   Annual Exam    Subjective: [name] is a 46 y.o.  Female  present for CPE All past medical history, surgical history, allergies, family history, immunizations, medications and social history were updated in the electronic medical record today. All recent labs, ED visits and hospitalizations within the last year were reviewed.  Health maintenance:  Colonoscopy: MGF colon cancer (10s). Screen > cologuard ordered Mammogram: 09/25/2021> scheduled 09/2022 Cervical cancer screening: last 07/2016; normal/neg hpv completed here.F/U 5 years.next  Immunizations: tdap administered today, Influenza administered today(encouraged yearly) Infectious disease screening: HIV  2013, hep c declined- she will check w/ insurance DEXA:mother severe osteoporosis, routine screen Patient has a Dental home. Hospitalizations/ED visits: reviewed      07/08/2022    2:47 PM 05/14/2021    3:08 PM 07/10/2020   10:30 AM 10/04/2018   10:05 AM 10/04/2018   10:04 AM  Depression screen PHQ 2/9  Decreased Interest 0 0 0  0  Down, Depressed, Hopeless 0 0 0  0  PHQ - 2 Score 0 0 0  0  Altered sleeping 0 0   0  Tired, decreased energy 0 0   0  Change in appetite 0 0   0  Feeling bad or failure about yourself  0 0   0  Trouble concentrating 0 0   0  Moving slowly or fidgety/restless 0 0   0  Suicidal thoughts 0 0   0  PHQ-9 Score 0 0   0  Difficult doing work/chores  Not difficult at all  Not difficult at all       07/08/2022    2:48 PM 05/14/2021    3:09 PM 07/10/2020   10:36 AM 05/29/2019    9:40 AM  GAD 7 : Generalized Anxiety Score  Nervous, Anxious, on Edge 0 0 1 1  Control/stop worrying 0 0 0 0  Worry too much - different things 0 0 0 0   Trouble relaxing 0 0 1 0  Restless 0 0 0 0  Easily annoyed or irritable 0 0 0 0  Afraid - awful might happen 0 0 0 0  Total GAD 7 Score 0 0 2 1  Anxiety Difficulty   Not difficult at all Not difficult at all    Immunization History  Administered Date(s) Administered   Influenza Split 09/10/2011, 09/14/2012   Influenza,inj,Quad PF,6+ Mos 08/23/2013, 10/02/2014, 08/28/2015, 09/03/2016, 09/20/2017, 10/04/2018, 09/13/2019, 07/29/2022   Tdap 03/05/2012, 07/29/2022   Past Medical History:  Diagnosis Date   ALLERGIC RHINITIS 11/21/2010   Allergy    seasonal   ANXIETY DEPRESSION 11/21/2010   Asthma    Dehydration 06/14/2012   GERD (gastroesophageal reflux disease)    Mastitis 06/14/2012   Other specified anemias 11/21/2010   Unspecified asthma(493.90) 03/18/2013   Allergies  Allergen Reactions   Ciprofloxacin Other (See Comments)    GI upset   Past Surgical History:  Procedure Laterality Date   BREAST LUMPECTOMY WITH RADIOACTIVE SEED LOCALIZATION Bilateral 05/15/2020   Procedure: BILATERAL BREAST LUMPECTOMY WITH RADIOACTIVE SEED LOCALIZATION;  Surgeon: Donnie Mesa, MD;  Location: [place];  Service: General;  Laterality: Bilateral;   NO PAST SURGERIES     Family  History  Problem Relation Age of Onset   Hyperlipidemia Mother    Osteoarthritis Mother    Diabetes Father        Type 2   Ulcers Father    Migraines Sister    Bipolar disorder Brother    Panic disorder Brother    Arthritis Maternal Grandmother    Cancer Maternal Grandmother        uterine   Cancer Maternal Grandfather 59       colon   Heart disease Maternal Grandfather    Heart attack Maternal Grandfather    Diabetes Paternal Grandmother    Hypertension Paternal Grandmother    Cancer Paternal Grandfather        multiple myeloma   Breast cancer Maternal Aunt    Anesthesia problems Neg Hx    Hypotension Neg Hx    Malignant hyperthermia Neg Hx    Pseudochol deficiency Neg Hx    Social History    Social History Narrative   Not on file    Allergies as of 07/29/2022       Reactions   Ciprofloxacin Other (See Comments)   GI upset        Medication List        Accurate as of July 29, 2022  1:25 PM. If you have any questions, ask your nurse or doctor.          buPROPion 100 MG tablet Commonly known as: WELLBUTRIN Take 1 tablet (100 mg total) by mouth daily.   fluticasone 50 MCG/ACT nasal spray Commonly known as: FLONASE Place 2 sprays into both nostrils 2 (two) times daily.   LORazepam 1 MG tablet Commonly known as: ATIVAN Take 1 tablet (1 mg total) by mouth 2 (two) times daily as needed for anxiety.   omeprazole 20 MG capsule Commonly known as: PRILOSEC Take 1 capsule (20 mg total) by mouth daily.   PARoxetine 30 MG tablet Commonly known as: PAXIL TAKE 1 TABLET BY MOUTH ONCE DAILY        All past medical history, surgical history, allergies, family history, immunizations andmedications were updated in the EMR today and reviewed under the history and medication portions of their EMR.     No results found for this or any previous visit (from the past 2160 hour(s)).   ROS 14 pt review of systems performed and negative (unless mentioned in an HPI)  Objective: BP 114/77   Pulse 71   Temp 98.5 F (36.9 C)   Ht 5' 9"  (1.753 m)   Wt 175 lb (79.4 kg)   SpO2 98%   BMI 25.84 kg/m  Physical Exam Vitals and nursing note reviewed.  Constitutional:      General: She is not in acute distress.    Appearance: Normal appearance. She is not ill-appearing or toxic-appearing.  HENT:     Head: Normocephalic and atraumatic.     Right Ear: Tympanic membrane, ear canal and external ear normal. There is no impacted cerumen.     Left Ear: Tympanic membrane, ear canal and external ear normal. There is no impacted cerumen.     Nose: No congestion or rhinorrhea.     Mouth/Throat:     Mouth: Mucous membranes are moist.     Pharynx: Oropharynx is clear. No  oropharyngeal exudate or posterior oropharyngeal erythema.  Eyes:     General: No scleral icterus.       Right eye: No discharge.        Left eye: No discharge.  Extraocular Movements: Extraocular movements intact.     Conjunctiva/sclera: Conjunctivae normal.     Pupils: Pupils are equal, round, and reactive to light.  Cardiovascular:     Rate and Rhythm: Normal rate and regular rhythm.     Pulses: Normal pulses.     Heart sounds: Normal heart sounds. No murmur heard.    No friction rub. No gallop.  Pulmonary:     Effort: Pulmonary effort is normal. No respiratory distress.     Breath sounds: Normal breath sounds. No stridor. No wheezing, rhonchi or rales.  Chest:     Chest wall: No tenderness.  Abdominal:     General: Abdomen is flat. Bowel sounds are normal. There is no distension.     Palpations: Abdomen is soft. There is no mass.     Tenderness: There is no abdominal tenderness. There is no right CVA tenderness, left CVA tenderness, guarding or rebound.     Hernia: No hernia is present.  Musculoskeletal:        General: No swelling, tenderness or deformity. Normal range of motion.     Cervical back: Normal range of motion and neck supple. No rigidity or tenderness.     Right lower leg: No edema.     Left lower leg: No edema.  Lymphadenopathy:     Cervical: No cervical adenopathy.  Skin:    General: Skin is warm and dry.     Coloration: Skin is not jaundiced or pale.     Findings: No bruising, erythema, lesion or rash.  Neurological:     General: No focal deficit present.     Mental Status: She is alert and oriented to person, place, and time. Mental status is at baseline.     Cranial Nerves: No cranial nerve deficit.     Sensory: No sensory deficit.     Motor: No weakness.     Coordination: Coordination normal.     Gait: Gait normal.     Deep Tendon Reflexes: Reflexes normal.  Psychiatric:        Mood and Affect: Mood normal.        Behavior: Behavior normal.         Thought Content: Thought content normal.        Judgment: Judgment normal.       No results found.  Assessment/plan: [name] is a 46 y.o. female present for CPE  Generalized anxiety disorder - TSH Encounter for long-term current use of medication - CBC - Comprehensive metabolic panel - Hemoglobin A1c - Lipid panel - TSH Vitamin D deficiency - VITAMIN D 25 Hydroxy (Vit-D Deficiency, Fractures) Colon cancer screening - Cologuard; Future Breast cancer screening by mammogram/ Intraductal papilloma of both breasts-status post resection scheduled Influenza vaccine needed - Flu Vaccine QUAD 42moIM (Fluarix, Fluzone & Alfiuria Quad PF) Need for Tdap vaccination - Tdap vaccine greater than or equal to 7yo IM Routine general medical examination at a health care facility Colonoscopy: MGF colon cancer (747s. Screen > cologuard ordered Mammogram: 09/25/2021> scheduled 09/2022 Cervical cancer screening: last 07/2016; normal/neg hpv completed here.F/U 5 years. Immunizations: tdap administered today, Influenza administered today(encouraged yearly) Infectious disease screening: HIV  2013, hep c declined- she will check w/ insurance DEXA:mother severe osteoporosis, routine screen Patient was encouraged to exercise greater than 150 minutes a week. Patient was encouraged to choose a diet filled with fresh fruits and vegetables, and lean meats. AVS provided to patient today for education/recommendation on gender specific health and safety maintenance.  Return in about 1 year (around 07/31/2023) for cpe (20 min), Routine chronic condition follow-up w/ PAP.  Orders Placed This Encounter  Procedures   Tdap vaccine greater than or equal to 7yo IM   Flu Vaccine QUAD 13moIM (Fluarix, Fluzone & Alfiuria Quad PF)   VITAMIN D 25 Hydroxy (Vit-D Deficiency, Fractures)   CBC   Comprehensive metabolic panel   Hemoglobin A1c   Lipid panel   TSH   Cologuard    No orders of the defined  types were placed in this encounter.  Referral Orders  No referral(s) requested today     Electronically signed by: RHoward Pouch DOswego

## 2022-07-30 LAB — COMPREHENSIVE METABOLIC PANEL
ALT: 7 U/L (ref 0–35)
AST: 17 U/L (ref 0–37)
Albumin: 4.1 g/dL (ref 3.5–5.2)
Alkaline Phosphatase: 43 U/L (ref 39–117)
BUN: 13 mg/dL (ref 6–23)
CO2: 28 mEq/L (ref 19–32)
Calcium: 9.2 mg/dL (ref 8.4–10.5)
Chloride: 104 mEq/L (ref 96–112)
Creatinine, Ser: 1.12 mg/dL (ref 0.40–1.20)
GFR: 58.52 mL/min — ABNORMAL LOW (ref >60.00)
Glucose, Bld: 79 mg/dL (ref 70–99)
Potassium: 4.7 mEq/L (ref 3.5–5.1)
Sodium: 140 mEq/L (ref 135–145)
Total Bilirubin: 0.4 mg/dL (ref 0.2–1.2)
Total Protein: 6.9 g/dL (ref 6.0–8.3)

## 2022-07-30 LAB — CBC
HCT: 39.5 % (ref 36.0–46.0)
Hemoglobin: 13.3 g/dL (ref 12.0–15.0)
MCHC: 33.6 g/dL (ref 30.0–36.0)
MCV: 87.8 fl (ref 78.0–100.0)
Platelets: 176 10*3/uL (ref 150.0–400.0)
RBC: 4.5 Mil/uL (ref 3.87–5.11)
RDW: 14.8 % (ref 11.5–15.5)
WBC: 4.1 10*3/uL (ref 4.0–10.5)

## 2022-07-30 LAB — LIPID PANEL
Cholesterol: 242 mg/dL — ABNORMAL HIGH (ref 0–200)
HDL: 68 mg/dL (ref >39.00)
LDL Cholesterol: 157 mg/dL — ABNORMAL HIGH (ref 0–99)
NonHDL: 173.63
Total CHOL/HDL Ratio: 4
Triglycerides: 82 mg/dL (ref 0.0–149.0)
VLDL: 16.4 mg/dL (ref 0.0–40.0)

## 2022-07-30 LAB — HEMOGLOBIN A1C: Hgb A1c MFr Bld: 5.6 % (ref 4.6–6.5)

## 2022-07-30 LAB — TSH: TSH: 0.82 u[IU]/mL (ref 0.35–5.50)

## 2022-07-30 LAB — VITAMIN D 25 HYDROXY (VIT D DEFICIENCY, FRACTURES): VITD: 21.47 ng/mL — ABNORMAL LOW (ref 30.00–100.00)

## 2022-07-31 ENCOUNTER — Telehealth: Payer: Self-pay | Admitting: Family Medicine

## 2022-07-31 NOTE — Telephone Encounter (Signed)
Spoke with pt regarding labs and instructions.   

## 2022-07-31 NOTE — Telephone Encounter (Signed)
Please call patient Liver and thyroid function are normal Kidney function is stable. Blood cell counts and electrolytes are normal Diabetes screening/A1c is normal  Cholesterol panel is higher this year.  Her HDL/good cholesterol still looks great at 68.  Her triglycerides are normal.  However her LDL/bad cholesterol is 157 this year.  This is the highest it has been.  Her cholesterol ratio went up to 4, this is still considered average risk, therefore medications do not need to be started at this time.  - But I would encourage her to attempt to obtain routine exercise at least 150 minutes a week.  Follow a low saturated fat high-fiber diet full of lean meats, fresh fruits and vegetables.  Avoid butters and deep-fried food.  Use all of oil or canola oil in place of any other oils.   Vitamin D is pretty low at 21.  Would encourage her to add 1000 units of vitamin D OTC to her daily regimen.

## 2022-08-07 ENCOUNTER — Other Ambulatory Visit (HOSPITAL_COMMUNITY): Payer: Self-pay

## 2022-08-13 DIAGNOSIS — H5213 Myopia, bilateral: Secondary | ICD-10-CM | POA: Diagnosis not present

## 2022-08-13 DIAGNOSIS — H524 Presbyopia: Secondary | ICD-10-CM | POA: Diagnosis not present

## 2022-09-29 ENCOUNTER — Ambulatory Visit
Admission: RE | Admit: 2022-09-29 | Discharge: 2022-09-29 | Disposition: A | Discharge status: Home / Self Care | Payer: 59 | Source: Ambulatory Visit | Attending: Family Medicine | Admitting: Family Medicine

## 2022-09-29 DIAGNOSIS — R92323 Mammographic fibroglandular density, bilateral breasts: Secondary | ICD-10-CM | POA: Diagnosis not present

## 2022-09-29 DIAGNOSIS — N6002 Solitary cyst of left breast: Secondary | ICD-10-CM

## 2022-09-29 DIAGNOSIS — N6001 Solitary cyst of right breast: Secondary | ICD-10-CM

## 2022-09-29 DIAGNOSIS — N6313 Unspecified lump in the right breast, lower outer quadrant: Secondary | ICD-10-CM | POA: Diagnosis not present

## 2022-09-29 DIAGNOSIS — N6324 Unspecified lump in the left breast, lower inner quadrant: Secondary | ICD-10-CM | POA: Diagnosis not present

## 2022-09-29 DIAGNOSIS — N6311 Unspecified lump in the right breast, upper outer quadrant: Secondary | ICD-10-CM | POA: Diagnosis not present

## 2022-09-29 DIAGNOSIS — N6322 Unspecified lump in the left breast, upper inner quadrant: Secondary | ICD-10-CM | POA: Diagnosis not present

## 2022-10-02 ENCOUNTER — Other Ambulatory Visit (HOSPITAL_COMMUNITY): Payer: Self-pay

## 2022-10-03 ENCOUNTER — Other Ambulatory Visit (HOSPITAL_COMMUNITY): Payer: Self-pay

## 2022-11-02 ENCOUNTER — Other Ambulatory Visit: Payer: Self-pay

## 2022-11-27 ENCOUNTER — Other Ambulatory Visit: Payer: Self-pay

## 2022-11-27 ENCOUNTER — Other Ambulatory Visit (HOSPITAL_COMMUNITY): Payer: Self-pay

## 2022-11-27 DIAGNOSIS — H01114 Allergic dermatitis of left upper eyelid: Secondary | ICD-10-CM | POA: Diagnosis not present

## 2022-11-27 DIAGNOSIS — H01111 Allergic dermatitis of right upper eyelid: Secondary | ICD-10-CM | POA: Diagnosis not present

## 2022-11-27 DIAGNOSIS — L299 Pruritus, unspecified: Secondary | ICD-10-CM | POA: Diagnosis not present

## 2022-11-27 MED ORDER — TRIAMCINOLONE ACETONIDE 0.1 % EX OINT
TOPICAL_OINTMENT | CUTANEOUS | 2 refills | Status: DC
  Filled 2022-11-27 (×2): qty 30, 15d supply, fill #0

## 2022-11-27 MED ORDER — TACROLIMUS 0.1 % EX OINT
TOPICAL_OINTMENT | CUTANEOUS | 3 refills | Status: DC
  Filled 2022-11-27 (×2): qty 60, 30d supply, fill #0

## 2022-12-22 ENCOUNTER — Other Ambulatory Visit: Payer: Self-pay

## 2023-01-01 ENCOUNTER — Other Ambulatory Visit (HOSPITAL_BASED_OUTPATIENT_CLINIC_OR_DEPARTMENT_OTHER): Payer: Self-pay

## 2023-01-29 ENCOUNTER — Other Ambulatory Visit: Payer: Self-pay

## 2023-01-29 ENCOUNTER — Other Ambulatory Visit (HOSPITAL_COMMUNITY): Payer: Self-pay

## 2023-01-29 ENCOUNTER — Encounter (HOSPITAL_COMMUNITY): Payer: Self-pay

## 2023-03-22 ENCOUNTER — Other Ambulatory Visit (HOSPITAL_COMMUNITY): Payer: Self-pay

## 2023-03-31 ENCOUNTER — Other Ambulatory Visit: Payer: Self-pay

## 2023-04-29 ENCOUNTER — Other Ambulatory Visit (HOSPITAL_COMMUNITY): Payer: Self-pay

## 2023-06-25 ENCOUNTER — Other Ambulatory Visit: Payer: Self-pay | Admitting: Family Medicine

## 2023-06-25 DIAGNOSIS — F411 Generalized anxiety disorder: Secondary | ICD-10-CM

## 2023-07-09 ENCOUNTER — Other Ambulatory Visit: Payer: Self-pay | Admitting: Family Medicine

## 2023-07-09 DIAGNOSIS — F411 Generalized anxiety disorder: Secondary | ICD-10-CM

## 2023-07-11 ENCOUNTER — Other Ambulatory Visit: Payer: Self-pay | Admitting: Family Medicine

## 2023-07-11 DIAGNOSIS — F411 Generalized anxiety disorder: Secondary | ICD-10-CM

## 2023-07-12 ENCOUNTER — Other Ambulatory Visit: Payer: Self-pay

## 2023-07-12 ENCOUNTER — Other Ambulatory Visit (HOSPITAL_COMMUNITY): Payer: Self-pay

## 2023-07-12 DIAGNOSIS — F411 Generalized anxiety disorder: Secondary | ICD-10-CM

## 2023-07-12 MED ORDER — PAROXETINE HCL 30 MG PO TABS
30.0000 mg | ORAL_TABLET | Freq: Every day | ORAL | 0 refills | Status: DC
  Filled 2023-07-12: qty 30, 30d supply, fill #0

## 2023-07-27 ENCOUNTER — Encounter: Payer: Self-pay | Admitting: Family Medicine

## 2023-07-27 ENCOUNTER — Other Ambulatory Visit: Payer: Self-pay

## 2023-07-27 ENCOUNTER — Ambulatory Visit: Payer: Commercial Managed Care - PPO | Admitting: Family Medicine

## 2023-07-27 ENCOUNTER — Other Ambulatory Visit (HOSPITAL_COMMUNITY): Payer: Self-pay

## 2023-07-27 VITALS — BP 106/75 | HR 67 | Temp 98.1°F | Wt 179.6 lb

## 2023-07-27 DIAGNOSIS — Z23 Encounter for immunization: Secondary | ICD-10-CM | POA: Diagnosis not present

## 2023-07-27 DIAGNOSIS — Z1211 Encounter for screening for malignant neoplasm of colon: Secondary | ICD-10-CM | POA: Diagnosis not present

## 2023-07-27 DIAGNOSIS — F411 Generalized anxiety disorder: Secondary | ICD-10-CM

## 2023-07-27 DIAGNOSIS — D241 Benign neoplasm of right breast: Secondary | ICD-10-CM | POA: Diagnosis not present

## 2023-07-27 DIAGNOSIS — D242 Benign neoplasm of left breast: Secondary | ICD-10-CM | POA: Diagnosis not present

## 2023-07-27 MED ORDER — BUPROPION HCL 100 MG PO TABS
100.0000 mg | ORAL_TABLET | Freq: Every day | ORAL | 3 refills | Status: DC
  Filled 2023-07-27: qty 90, 90d supply, fill #0
  Filled 2023-10-22: qty 90, 90d supply, fill #1
  Filled 2024-02-03: qty 90, 90d supply, fill #2
  Filled 2024-04-22 – 2024-04-24 (×2): qty 90, 90d supply, fill #3

## 2023-07-27 MED ORDER — LORAZEPAM 1 MG PO TABS
1.0000 mg | ORAL_TABLET | Freq: Two times a day (BID) | ORAL | 1 refills | Status: AC | PRN
  Filled 2023-07-27: qty 180, 90d supply, fill #0

## 2023-07-27 MED ORDER — FLUTICASONE PROPIONATE 50 MCG/ACT NA SUSP
2.0000 | Freq: Two times a day (BID) | NASAL | 11 refills | Status: DC
  Filled 2023-07-27: qty 16, 30d supply, fill #0
  Filled 2023-10-22: qty 16, 30d supply, fill #1
  Filled 2023-11-16: qty 16, 30d supply, fill #2
  Filled 2023-12-27: qty 16, 30d supply, fill #3
  Filled 2024-02-03: qty 16, 30d supply, fill #4
  Filled 2024-07-18: qty 16, 30d supply, fill #5

## 2023-07-27 MED ORDER — PAROXETINE HCL 30 MG PO TABS
30.0000 mg | ORAL_TABLET | Freq: Every day | ORAL | 3 refills | Status: DC
  Filled 2023-07-27 – 2023-08-08 (×2): qty 90, 90d supply, fill #0
  Filled 2023-11-01: qty 90, 90d supply, fill #1
  Filled 2024-02-03: qty 90, 90d supply, fill #2
  Filled 2024-04-22 – 2024-04-24 (×2): qty 90, 90d supply, fill #3

## 2023-07-27 NOTE — Patient Instructions (Addendum)
Return in about 1 year (around 07/27/2024) for cpe (40 min), Routine chronic condition follow-up w/ PAP.        Great to see you today.  I have refilled the medication(s) we provide.   If labs were collected or images ordered, we will inform you of  results once we have received them and reviewed. We will contact you either by echart message, or telephone call.  Please give ample time to the testing facility, and our office to run,  receive and review results. Please do not call inquiring of results, even if you can see them in your chart. We will contact you as soon as we are able. If it has been over 1 week since the test was completed, and you have not yet heard from [place], then please call us.    - echart message- for normal results that have been seen by the patient already.   - telephone call: abnormal results or if patient has not viewed results in their echart.  If a referral to a specialist was entered for you, please call us in 2 weeks if you have not heard from the specialist office to schedule.

## 2023-07-27 NOTE — Progress Notes (Signed)
[name], female  DOB: [birth year], 47 y.o.   MRN: [id number] Patient Care Team    Relationship Specialty Notifications Start End  Natalia Leatherwood, DO PCP - General Family Medicine  08/28/15     Chief Complaint  Patient presents with   Medical Management of Chronic Issues    Subjective: [name] is a 47 y.o.  Female  present for CPE All past medical history, surgical history, allergies, family history, immunizations, medications and social history were updated in the electronic medical record today. All recent labs, ED visits and hospitalizations within the last year were reviewed.  Health maintenance:  Colonoscopy: MGF colon cancer (70s). Screen > cologuard ordered Mammogram: s/p intraductal papilloma resection - rpt mm diag due 10/01/2023 with b/l breast US  Anxiety: Reports compliance with Paxil 30 mg daily and Wellbutrin 100 mg daily.  She rarely is in need of the Ativan, but prescription is outdated.     07/27/2023    9:08 AM 07/29/2022    1:34 PM 07/08/2022    2:47 PM 05/14/2021    3:08 PM 07/10/2020   10:30 AM  Depression screen PHQ 2/9  Decreased Interest 0 0 0 0 0  Down, Depressed, Hopeless 0 0 0 0 0  PHQ - 2 Score 0 0 0 0 0  Altered sleeping  0 0 0   Tired, decreased energy  0 0 0   Change in appetite  0 0 0   Feeling bad or failure about yourself   0 0 0   Trouble concentrating  0 0 0   Moving slowly or fidgety/restless  0 0 0   Suicidal thoughts  0 0 0   PHQ-9 Score  0 0 0   Difficult doing work/chores    Not difficult at all       07/29/2022    1:35 PM 07/08/2022    2:48 PM 05/14/2021    3:09 PM 07/10/2020   10:36 AM  GAD 7 : Generalized Anxiety Score  Nervous, Anxious, on Edge 0 0 0 1  Control/stop worrying 0 0 0 0  Worry too much - different things 0 0 0 0  Trouble relaxing 0 0 0 1  Restless 0 0 0 0  Easily annoyed or irritable 0 0 0 0  Afraid - awful might happen 0 0 0 0  Total GAD 7 Score 0 0 0 2  Anxiety Difficulty  Not difficult at all   Not difficult at all    Immunization History  Administered Date(s) Administered   Influenza Split 09/10/2011, 09/14/2012   Influenza, Seasonal, Injecte, Preservative Fre 07/27/2023   Influenza,inj,Quad PF,6+ Mos 08/23/2013, 10/02/2014, 08/28/2015, 09/03/2016, 09/20/2017, 10/04/2018, 09/13/2019, 07/29/2022   Tdap 03/05/2012, 07/29/2022   Past Medical History:  Diagnosis Date   ALLERGIC RHINITIS 11/21/2010   Allergy    seasonal   ANXIETY DEPRESSION 11/21/2010   Asthma    Dehydration 06/14/2012   GERD (gastroesophageal reflux disease)    Mastitis 06/14/2012   Other specified anemias 11/21/2010   Unspecified asthma(493.90) 03/18/2013   Allergies  Allergen Reactions   Ciprofloxacin Other (See Comments)    GI upset   Past Surgical History:  Procedure Laterality Date   BREAST LUMPECTOMY WITH RADIOACTIVE SEED LOCALIZATION Bilateral 05/15/2020   Procedure: BILATERAL BREAST LUMPECTOMY WITH RADIOACTIVE SEED LOCALIZATION;  Surgeon: Manus Rudd, MD;  Location: [facility];  Service: General;  Laterality: Bilateral;   NO PAST SURGERIES     Family  History  Problem Relation Age of Onset   Hyperlipidemia Mother    Osteoarthritis Mother    Diabetes Father        Type 2   Ulcers Father    Migraines Sister    Bipolar disorder Brother    Panic disorder Brother    Arthritis Maternal Grandmother    Cancer Maternal Grandmother        uterine   Cancer Maternal Grandfather 35       colon   Heart disease Maternal Grandfather    Heart attack Maternal Grandfather    Diabetes Paternal Grandmother    Hypertension Paternal Grandmother    Cancer Paternal Grandfather        multiple myeloma   Breast cancer Maternal Aunt    Anesthesia problems Neg Hx    Hypotension Neg Hx    Malignant hyperthermia Neg Hx    Pseudochol deficiency Neg Hx    Social History   Social History Narrative   Not on file    Allergies as of 07/27/2023       Reactions   Ciprofloxacin  Other (See Comments)   GI upset        Medication List        Accurate as of July 27, 2023 12:36 PM. If you have any questions, ask your nurse or doctor.          STOP taking these medications    omeprazole 20 MG capsule Commonly known as: PRILOSEC Stopped by: Felix Pacini   tacrolimus 0.1 % ointment Commonly known as: PROTOPIC Stopped by: Felix Pacini   triamcinolone ointment 0.1 % Commonly known as: KENALOG Stopped by: Felix Pacini       TAKE these medications    buPROPion 100 MG tablet Commonly known as: WELLBUTRIN Take 1 tablet (100 mg total) by mouth daily.   fluticasone 50 MCG/ACT nasal spray Commonly known as: FLONASE Place 2 sprays into both nostrils 2 (two) times daily.   LORazepam 1 MG tablet Commonly known as: ATIVAN Take 1 tablet (1 mg total) by mouth 2 (two) times daily as needed for anxiety.   PARoxetine 30 MG tablet Commonly known as: PAXIL Take 1 tablet (30 mg total) by mouth daily.        All past medical history, surgical history, allergies, family history, immunizations andmedications were updated in the EMR today and reviewed under the history and medication portions of their EMR.     No results found for this or any previous visit (from the past 2160 hour(s)).   ROS 14 pt review of systems performed and negative (unless mentioned in an HPI)  Objective: BP 106/75   Pulse 67   Temp 98.1 F (36.7 C)   Wt 179 lb 9.6 oz (81.5 kg)   SpO2 98%   BMI 26.52 kg/m  Physical Exam Vitals and nursing note reviewed.  Constitutional:      General: She is not in acute distress.    Appearance: Normal appearance. She is normal weight. She is not ill-appearing or toxic-appearing.  HENT:     Head: Normocephalic and atraumatic.  Eyes:     General: No scleral icterus.       Right eye: No discharge.        Left eye: No discharge.     Extraocular Movements: Extraocular movements intact.     Conjunctiva/sclera: Conjunctivae normal.      Pupils: Pupils are equal, round, and reactive to light.  Skin:    Findings: No  rash.  Neurological:     Mental Status: She is alert and oriented to person, place, and time. Mental status is at baseline.     Motor: No weakness.     Coordination: Coordination normal.     Gait: Gait normal.  Psychiatric:        Mood and Affect: Mood normal.        Behavior: Behavior normal.        Thought Content: Thought content normal.        Judgment: Judgment normal.       No results found.  Assessment/plan: [name] is a 47 y.o. female present for  Generalized anxiety disorder stable - continue buPROPion (WELLBUTRIN) 100 MG tablet; Take 1 tablet (100 mg total) by mouth daily.  Dispense: 90 tablet; Refill: 3 - continue PARoxetine (PAXIL) 30 MG tablet; Take 1 tablet (30 mg total) by mouth daily.  Dispense: 90 tablet; Refill: 3 - continue ativan prn. 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  Colon cancer screening Cologuard ordered today.  Patient reports she never received the kit when ordered last year.   Return in about 1 year (around 07/27/2024) for cpe (40 min), Routine chronic condition follow-up w/ PAP.  Orders Placed This Encounter  Procedures   MM 3D DIAGNOSTIC MAMMOGRAM BILATERAL BREAST   US LIMITED ULTRASOUND INCLUDING AXILLA LEFT BREAST    [place] LIMITED ULTRASOUND INCLUDING AXILLA RIGHT BREAST   Flu vaccine trivalent PF, 6mos and older(Flulaval,Afluria,Fluarix,Fluzone)   Cologuard    Meds ordered this encounter  Medications   buPROPion (WELLBUTRIN) 100 MG tablet    Sig: Take 1 tablet (100 mg total) by mouth daily.    Dispense:  90 tablet    Refill:  3   PARoxetine  (PAXIL) 30 MG tablet    Sig: Take 1 tablet (30 mg total) by mouth daily.    Dispense:  90 tablet    Refill:  3   fluticasone (FLONASE) 50 MCG/ACT nasal spray    Sig: Place 2 sprays into both nostrils 2 (two) times daily.    Dispense:  16 g    Refill:  11   LORazepam (ATIVAN) 1 MG tablet    Sig: Take 1 tablet (1 mg total) by mouth 2 (two) times daily as needed for anxiety.    Dispense:  180 tablet    Refill:  1   Referral Orders  No referral(s) requested today     Electronically signed by: Felix Pacini, DO [facility] [place]

## 2023-07-28 ENCOUNTER — Other Ambulatory Visit (HOSPITAL_COMMUNITY): Payer: Self-pay

## 2023-07-29 ENCOUNTER — Other Ambulatory Visit: Payer: Self-pay

## 2023-08-08 ENCOUNTER — Other Ambulatory Visit (HOSPITAL_COMMUNITY): Payer: Self-pay

## 2023-08-09 ENCOUNTER — Other Ambulatory Visit (HOSPITAL_COMMUNITY): Payer: Self-pay

## 2023-08-11 DIAGNOSIS — Z1211 Encounter for screening for malignant neoplasm of colon: Secondary | ICD-10-CM | POA: Diagnosis not present

## 2023-08-16 LAB — COLOGUARD: COLOGUARD: NEGATIVE

## 2023-10-02 ENCOUNTER — Other Ambulatory Visit (HOSPITAL_COMMUNITY): Payer: Self-pay

## 2023-10-05 ENCOUNTER — Encounter: Payer: Self-pay | Admitting: Family Medicine

## 2023-10-05 ENCOUNTER — Other Ambulatory Visit (HOSPITAL_COMMUNITY): Payer: Self-pay

## 2023-10-05 ENCOUNTER — Other Ambulatory Visit: Payer: Commercial Managed Care - PPO

## 2023-10-05 ENCOUNTER — Ambulatory Visit
Admission: RE | Admit: 2023-10-05 | Discharge: 2023-10-05 | Disposition: A | Discharge status: Home / Self Care | Payer: Commercial Managed Care - PPO | Source: Ambulatory Visit | Attending: Family Medicine | Admitting: Family Medicine

## 2023-10-05 DIAGNOSIS — D241 Benign neoplasm of right breast: Secondary | ICD-10-CM

## 2023-10-05 DIAGNOSIS — N6011 Diffuse cystic mastopathy of right breast: Secondary | ICD-10-CM | POA: Diagnosis not present

## 2023-10-05 DIAGNOSIS — N6315 Unspecified lump in the right breast, overlapping quadrants: Secondary | ICD-10-CM | POA: Diagnosis not present

## 2023-10-05 DIAGNOSIS — N6325 Unspecified lump in the left breast, overlapping quadrants: Secondary | ICD-10-CM | POA: Diagnosis not present

## 2023-10-22 ENCOUNTER — Other Ambulatory Visit (HOSPITAL_COMMUNITY): Payer: Self-pay

## 2023-11-01 ENCOUNTER — Other Ambulatory Visit (HOSPITAL_COMMUNITY): Payer: Self-pay

## 2023-11-16 ENCOUNTER — Other Ambulatory Visit (HOSPITAL_COMMUNITY): Payer: Self-pay

## 2023-12-16 DIAGNOSIS — H52223 Regular astigmatism, bilateral: Secondary | ICD-10-CM | POA: Diagnosis not present

## 2023-12-27 ENCOUNTER — Other Ambulatory Visit (HOSPITAL_COMMUNITY): Payer: Self-pay

## 2024-02-03 ENCOUNTER — Other Ambulatory Visit (HOSPITAL_COMMUNITY): Payer: Self-pay

## 2024-04-24 ENCOUNTER — Other Ambulatory Visit (HOSPITAL_COMMUNITY): Payer: Self-pay

## 2024-04-24 ENCOUNTER — Other Ambulatory Visit: Payer: Self-pay

## 2024-07-18 ENCOUNTER — Other Ambulatory Visit (HOSPITAL_COMMUNITY): Payer: Self-pay

## 2024-08-02 DIAGNOSIS — Z139 Encounter for screening, unspecified: Secondary | ICD-10-CM | POA: Diagnosis not present

## 2024-08-02 DIAGNOSIS — N951 Menopausal and female climacteric states: Secondary | ICD-10-CM | POA: Diagnosis not present

## 2024-08-05 ENCOUNTER — Other Ambulatory Visit: Payer: Self-pay | Admitting: Family Medicine

## 2024-08-05 DIAGNOSIS — F411 Generalized anxiety disorder: Secondary | ICD-10-CM

## 2024-08-07 ENCOUNTER — Other Ambulatory Visit (HOSPITAL_COMMUNITY): Payer: Self-pay

## 2024-08-07 ENCOUNTER — Encounter (HOSPITAL_COMMUNITY): Payer: Self-pay

## 2024-08-08 ENCOUNTER — Other Ambulatory Visit: Payer: Self-pay

## 2024-08-08 ENCOUNTER — Telehealth: Admitting: Family Medicine

## 2024-08-08 ENCOUNTER — Other Ambulatory Visit (HOSPITAL_COMMUNITY): Payer: Self-pay

## 2024-08-08 ENCOUNTER — Encounter: Payer: Self-pay | Admitting: Family Medicine

## 2024-08-08 DIAGNOSIS — F411 Generalized anxiety disorder: Secondary | ICD-10-CM | POA: Diagnosis not present

## 2024-08-08 MED ORDER — PAROXETINE HCL 30 MG PO TABS
30.0000 mg | ORAL_TABLET | Freq: Every day | ORAL | 3 refills | Status: AC
  Filled 2024-08-08: qty 90, 90d supply, fill #0
  Filled 2024-11-02: qty 90, 90d supply, fill #1

## 2024-08-08 MED ORDER — BUPROPION HCL 100 MG PO TABS
100.0000 mg | ORAL_TABLET | Freq: Every day | ORAL | 3 refills | Status: AC
  Filled 2024-08-08: qty 90, 90d supply, fill #0
  Filled 2024-11-02: qty 90, 90d supply, fill #1

## 2024-08-08 NOTE — Patient Instructions (Signed)

## 2024-08-08 NOTE — Progress Notes (Signed)
 VIRTUAL VISIT VIA VIDEO  I connected with [name] on 08/08/24 at  2:20 PM EDT by a video enabled telemedicine application and verified that I am speaking with the correct person using two identifiers. Location patient: Home Location provider: [facility], Office Persons participating in the virtual visit: Patient, Dr. Catherine and ALONSO Sharps, CMA  I discussed the limitations of evaluation and management by telemedicine and the availability of in person appointments. The patient expressed understanding and agreed to proceed.    [name], female  DOB: November 30, 1975, 48 y.o.   MRN: [id number] Patient Care Team    Relationship Specialty Notifications Start End  Catherine Charlies LABOR, DO PCP - General Family Medicine  08/28/15     Chief Complaint  Patient presents with   Anxiety    Subjective: [name] is a 48 y.o.  Female  present for Chronic Conditions/illness Management  All past medical history, surgical history, allergies, family history, immunizations, medications and social history were updated in the electronic medical record today. All recent labs, ED visits and hospitalizations within the last year were reviewed.   Anxiety: Reports compliance with Paxil  30 mg daily and Wellbutrin  100 mg daily.  She rarely is in need of the Ativan , but prescription is outdated.  She feels anxiety is well-managed on current medications     08/08/2024    2:02 PM 07/27/2023    9:08 AM 07/29/2022    1:34 PM 07/08/2022    2:47 PM 05/14/2021    3:08 PM  Depression screen PHQ 2/9  Decreased Interest 0 0 0 0 0  Down, Depressed, Hopeless 0 0 0 0 0  PHQ - 2 Score 0 0 0 0 0  Altered sleeping 0  0 0 0  Tired, decreased energy 0  0 0 0  Change in appetite 0  0 0 0  Feeling bad or failure about yourself  0  0 0 0  Trouble concentrating 0  0 0 0  Moving slowly or fidgety/restless 0  0 0 0  Suicidal thoughts 0  0 0 0  PHQ-9 Score 0  0 0 0  Difficult doing  work/chores Not difficult at all    Not difficult at all      08/08/2024    2:02 PM 07/29/2022    1:35 PM 07/08/2022    2:48 PM 05/14/2021    3:09 PM  GAD 7 : Generalized Anxiety Score  Nervous, Anxious, on Edge 0 0 0 0  Control/stop worrying 0 0 0 0  Worry too much - different things 0 0 0 0  Trouble relaxing 0 0 0 0  Restless 0 0 0 0  Easily annoyed or irritable 0 0 0 0  Afraid - awful might happen 0 0 0 0  Total GAD 7 Score 0 0 0 0  Anxiety Difficulty Not difficult at all Not difficult at all        07/07/2016   10:32 AM 05/15/2020    9:58 AM 07/27/2023    9:08 AM 08/08/2024    2:02 PM  Fall Risk  Falls in the past year? No   0 0  Was there an injury with Fall?   0 0  Fall Risk Category Calculator   0 0  (RETIRED) Patient Fall Risk Level  Low fall risk     Patient at Risk for Falls Due to   No Fall Risks No Fall Risks  Fall risk  Follow up   Falls evaluation completed Falls evaluation completed     Data saved with a previous flowsheet row definition     Immunization History  Administered Date(s) Administered   Influenza Split 09/10/2011, 09/14/2012   Influenza, Seasonal, Injecte, Preservative Fre 07/27/2023   Influenza,inj,Quad PF,6+ Mos 08/23/2013, 10/02/2014, 08/28/2015, 09/03/2016, 09/20/2017, 10/04/2018, 09/13/2019, 07/29/2022   Tdap 03/05/2012, 07/29/2022   Past Medical History:  Diagnosis Date   ALLERGIC RHINITIS 11/21/2010   Allergic rhinitis 11/21/2010   Qualifier: Diagnosis of   By: Domenica MD, Stacey      IMO SNOMED Dx Update Oct 2024     Allergy    seasonal   ANXIETY DEPRESSION 11/21/2010   Asthma    Asthma 03/18/2013   Dehydration 06/14/2012   GERD (gastroesophageal reflux disease)    Mastitis 06/14/2012   Other specified anemias 11/21/2010   Unspecified asthma(493.90) 03/18/2013   Allergies  Allergen Reactions   Ciprofloxacin Other (See Comments)    GI upset   Past Surgical History:  Procedure Laterality Date   BREAST BIOPSY Left 2021   BREAST  BIOPSY Right 2021   BREAST EXCISIONAL BIOPSY Bilateral 2021   BREAST LUMPECTOMY WITH RADIOACTIVE SEED LOCALIZATION Bilateral 05/15/2020   Procedure: BILATERAL BREAST LUMPECTOMY WITH RADIOACTIVE SEED LOCALIZATION;  Surgeon: Belinda Cough, MD;  Location: [facility];  Service: General;  Laterality: Bilateral;   NO PAST SURGERIES     Family History  Problem Relation Age of Onset   Hyperlipidemia Mother    Osteoarthritis Mother    Diabetes Father        Type 2   Ulcers Father    Migraines Sister    Bipolar disorder Brother    Panic disorder Brother    Arthritis Maternal Grandmother    Cancer Maternal Grandmother        uterine   Cancer Maternal Grandfather 81       colon   Heart disease Maternal Grandfather    Heart attack Maternal Grandfather    Diabetes Paternal Grandmother    Hypertension Paternal Grandmother    Cancer Paternal Grandfather        multiple myeloma   Breast cancer Maternal Aunt    Anesthesia problems Neg Hx    Hypotension Neg Hx    Malignant hyperthermia Neg Hx    Pseudochol deficiency Neg Hx    Social History   Social History Narrative   Not on file    Allergies as of 08/08/2024       Reactions   Ciprofloxacin Other (See Comments)   GI upset        Medication List        Accurate as of August 08, 2024  2:02 PM. If you have any questions, ask your nurse or doctor.          buPROPion  100 MG tablet Commonly known as: WELLBUTRIN  Take 1 tablet (100 mg total) by mouth daily.   fluticasone  50 MCG/ACT nasal spray Commonly known as: FLONASE  Place 2 sprays into both nostrils 2 (two) times daily.   LORazepam  1 MG tablet Commonly known as: ATIVAN  Take 1 tablet (1 mg total) by mouth 2 (two) times daily as needed for anxiety.   PARoxetine  30 MG tablet Commonly known as: PAXIL  Take 1 tablet (30 mg total) by mouth daily.        All past medical history, surgical history, allergies, family history, immunizations  andmedications were updated in the EMR today and reviewed under the history and medication  portions of their EMR.     No results found for this or any previous visit (from the past 2160 hours).   ROS 14 pt review of systems performed and negative (unless mentioned in an HPI)  Objective: There were no vitals taken for this visit. Physical Exam Vitals and nursing note reviewed.  Constitutional:      General: She is not in acute distress.    Appearance: Normal appearance. She is normal weight. She is not ill-appearing or toxic-appearing.  HENT:     Head: Normocephalic and atraumatic.  Eyes:     General: No scleral icterus.       Right eye: No discharge.        Left eye: No discharge.     Extraocular Movements: Extraocular movements intact.     Conjunctiva/sclera: Conjunctivae normal.     Pupils: Pupils are equal, round, and reactive to light.  Pulmonary:     Effort: Pulmonary effort is normal.  Musculoskeletal:     Cervical back: Normal range of motion.  Skin:    Findings: No rash.  Neurological:     Mental Status: She is alert and oriented to person, place, and time. Mental status is at baseline.     Motor: No weakness.     Coordination: Coordination normal.     Gait: Gait normal.  Psychiatric:        Mood and Affect: Mood normal.        Behavior: Behavior normal.        Thought Content: Thought content normal.        Judgment: Judgment normal.      No results found.  Assessment/plan: [name] is a 48 y.o. female present for  Generalized anxiety disorder Stable Continue buPROPion  (WELLBUTRIN ) 100 MG tablet; Take 1 tablet (100 mg total) by mouth daily.  Dispense: 90 tablet; Refill: 3 Continue PARoxetine  (PAXIL ) 30 MG tablet; Take 1 tablet (30 mg total) by mouth daily.  Dispense: 90 tablet; Refill: 3 Continue ativan  prn. NCCS database reviewed and appropriate- not needing refills today    No follow-ups on file.  No orders of the defined types were placed  in this encounter.   Meds ordered this encounter  Medications   buPROPion  (WELLBUTRIN ) 100 MG tablet    Sig: Take 1 tablet (100 mg total) by mouth daily.    Dispense:  90 tablet    Refill:  3    Due for an appointment if further refills requested   PARoxetine  (PAXIL ) 30 MG tablet    Sig: Take 1 tablet (30 mg total) by mouth daily.    Dispense:  90 tablet    Refill:  3    Due for an appointment for further refills requested   Referral Orders  No referral(s) requested today     Electronically signed by: Charlies Bellini, DO [facility] [place]

## 2024-08-11 ENCOUNTER — Other Ambulatory Visit: Payer: Self-pay

## 2024-08-11 ENCOUNTER — Other Ambulatory Visit: Payer: Self-pay | Admitting: Family Medicine

## 2024-08-11 ENCOUNTER — Other Ambulatory Visit (HOSPITAL_COMMUNITY): Payer: Self-pay

## 2024-08-11 MED ORDER — FLUTICASONE PROPIONATE 50 MCG/ACT NA SUSP
2.0000 | Freq: Two times a day (BID) | NASAL | 3 refills | Status: AC
  Filled 2024-08-11: qty 16, 30d supply, fill #0
  Filled 2024-09-14: qty 16, 30d supply, fill #1
  Filled 2024-11-02: qty 16, 30d supply, fill #2
  Filled 2024-12-12: qty 16, 30d supply, fill #3

## 2024-08-22 DIAGNOSIS — Z124 Encounter for screening for malignant neoplasm of cervix: Secondary | ICD-10-CM | POA: Diagnosis not present

## 2024-08-22 DIAGNOSIS — Z1151 Encounter for screening for human papillomavirus (HPV): Secondary | ICD-10-CM | POA: Diagnosis not present

## 2024-09-14 ENCOUNTER — Other Ambulatory Visit: Payer: Self-pay

## 2024-11-06 ENCOUNTER — Other Ambulatory Visit: Payer: Self-pay | Admitting: Family Medicine

## 2024-11-06 DIAGNOSIS — Z1231 Encounter for screening mammogram for malignant neoplasm of breast: Secondary | ICD-10-CM

## 2024-11-30 ENCOUNTER — Ambulatory Visit
Admission: RE | Admit: 2024-11-30 | Discharge: 2024-11-30 | Disposition: A | Source: Ambulatory Visit | Attending: Family Medicine | Admitting: Family Medicine

## 2024-11-30 DIAGNOSIS — Z1231 Encounter for screening mammogram for malignant neoplasm of breast: Secondary | ICD-10-CM

## 2024-12-05 ENCOUNTER — Other Ambulatory Visit: Payer: Self-pay | Admitting: Family Medicine

## 2024-12-05 DIAGNOSIS — R928 Other abnormal and inconclusive findings on diagnostic imaging of breast: Secondary | ICD-10-CM

## 2024-12-12 ENCOUNTER — Other Ambulatory Visit (HOSPITAL_COMMUNITY): Payer: Self-pay

## 2024-12-12 ENCOUNTER — Ambulatory Visit
Admission: RE | Admit: 2024-12-12 | Discharge: 2024-12-12 | Disposition: A | Source: Ambulatory Visit | Attending: Family Medicine | Admitting: Family Medicine

## 2024-12-12 DIAGNOSIS — R928 Other abnormal and inconclusive findings on diagnostic imaging of breast: Secondary | ICD-10-CM

## 2024-12-13 ENCOUNTER — Other Ambulatory Visit: Payer: Self-pay | Admitting: Family Medicine

## 2024-12-13 ENCOUNTER — Other Ambulatory Visit: Payer: Self-pay

## 2024-12-13 ENCOUNTER — Other Ambulatory Visit (HOSPITAL_COMMUNITY): Payer: Self-pay

## 2024-12-13 MED ORDER — FLUTICASONE PROPIONATE 50 MCG/ACT NA SUSP
2.0000 | Freq: Two times a day (BID) | NASAL | 3 refills | Status: AC
  Filled 2024-12-13: qty 48, 90d supply, fill #0
